# Patient Record
Sex: Female | Born: 1985 | Race: Black or African American | Hispanic: No | Marital: Single | Smoking: Never smoker
Health system: Southern US, Community
[De-identification: ages and names within clinical notes are randomized; demographics above are authoritative.]

## PROBLEM LIST (undated history)

## (undated) ENCOUNTER — Emergency Department (HOSPITAL_COMMUNITY): Admission: EM | Payer: Self-pay | Source: Home / Self Care

## (undated) ENCOUNTER — Inpatient Hospital Stay (HOSPITAL_COMMUNITY): Payer: Self-pay

## (undated) DIAGNOSIS — N730 Acute parametritis and pelvic cellulitis: Secondary | ICD-10-CM

## (undated) DIAGNOSIS — D573 Sickle-cell trait: Secondary | ICD-10-CM

## (undated) HISTORY — PX: NO PAST SURGERIES: SHX2092

---
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
## 2008-07-31 ENCOUNTER — Emergency Department (HOSPITAL_COMMUNITY): Admission: EM | Admit: 2008-07-31 | Discharge: 2008-07-31 | Payer: Self-pay | Admitting: Family Medicine

## 2010-01-10 ENCOUNTER — Emergency Department (HOSPITAL_COMMUNITY): Admission: EM | Admit: 2010-01-10 | Payer: Self-pay | Source: Home / Self Care | Admitting: Emergency Medicine

## 2010-04-07 ENCOUNTER — Other Ambulatory Visit: Payer: Self-pay | Admitting: Physician Assistant

## 2010-04-07 ENCOUNTER — Encounter: Payer: Self-pay | Admitting: Physician Assistant

## 2010-04-07 ENCOUNTER — Encounter: Payer: PRIVATE HEALTH INSURANCE | Admitting: Family

## 2010-04-07 DIAGNOSIS — N949 Unspecified condition associated with female genital organs and menstrual cycle: Secondary | ICD-10-CM

## 2010-04-07 DIAGNOSIS — R102 Pelvic and perineal pain: Secondary | ICD-10-CM

## 2010-04-07 DIAGNOSIS — Z01419 Encounter for gynecological examination (general) (routine) without abnormal findings: Secondary | ICD-10-CM

## 2010-04-07 LAB — CONVERTED CEMR LAB
FSH: 6.2 milliintl units/mL
HCV Ab: NEGATIVE
HIV: NONREACTIVE
Hepatitis B Surface Ag: NEGATIVE
LH: 3.6 milliintl units/mL
TSH: 1.972 microintl units/mL (ref 0.350–4.500)

## 2010-04-08 NOTE — Progress Notes (Signed)
Jennifer Burnett, [name] NO.:  [id number]  MEDICAL RECORD NO.:  [id number]           PATIENT TYPE:  A  LOCATION:  [facility]                   FACILITY:  [facility]  PHYSICIAN:  Maylon Cos, CNM    DATE OF BIRTH:  1985-02-21  DATE OF SERVICE:  04/07/2010                                 CLINIC NOTE  The patient is being seen at [facility]  REASON FOR TODAY'S VISIT:  Annual exam and Pap smear.  The patient also presents with complaints of abnormal bleeding.  She states that she has always had regular periods until this past December when she started having heavy periods that were lasting anywhere from 7 to 12 days and bleeding in between her periods.  Bleeding has been heavy enough for her to have to change a super tampon every 2-3 hours and she started passing small clots.  She denies symptoms of abnormal hair growth, pelvic pain outside of her menstrual cycles, or abnormal discharge.  PAST MEDICAL HISTORY:  The patient has no known drug allergies.  She is not currently taking any medication.  HEALTH CARE MAINTENANCE:  Her last dental exam was 6-8 months ago.  MENSTRUAL HISTORY:  She had menarche at age 25.  The first day of her last menstrual period was 03/30/2010.  CONTRACEPTIVE HISTORY:  She is currently trying to get pregnant, not on any birth control.  OBSTETRICAL HISTORY:  She is a gravida 1, para 0-0-1-0.  She had a termination at the age of 25.  GYNECOLOGICAL HISTORY:  Negative.  Last Pap smear is unknown, believed to be at age 25.  STD HISTORY:  The patient was treated at age 25 for PID.  She has no known history of endometriosis, fibroids, ovarian cyst.  PERSONAL MEDICAL HISTORY:  Negative.  SURGICAL HISTORY:  Negative.  SOCIAL HISTORY:  She is unemployed.  She does not smoke.  She occasionally drinks alcohol approximately 2-3 per week.  She had 1 sexual partner in the last year.  FAMILY HISTORY:  Positive for high blood  pressure in her grandmother.  REVIEW OF SYSTEMS:  Systemic review is negative other than what has already been reviewed in HPI.  PHYSICAL EXAMINATION:  GENERAL:  On examination, Jennifer Burnett is a pleasant 25- year-old [place] American female, appears to be her stated age. HEENT:  Grossly normal, in no apparent distress. VITAL SIGNS:  Blood pressure today is 132/92 and her weight is 173.9. NECK:  She has no thyromegaly. HEART:  Regular rate and rhythm without murmurs or bruits. LUNGS:  Clear to auscultation bilaterally A and P. BREASTS:  Symmetrical, soft without retraction or dimpling of the skin. Nipples are erect without discharge.  No masses, lesions, or tenderness to palpation. ABDOMEN:  Soft and nontender.  No hepatosplenomegaly. GENITALIA:  She has a shaved perineum without external lesions. Internal mucous membranes are pink without lesion.  She has a regular rugae and good tone.  Cervix is smooth and pink without lesions, and friable to exam.  Bimanual exam reveals no cervical motion tenderness. There is slight increase in size to approximately 9 weeks, however, there is no lobulation  or deformity in this contour.  Adnexa is nonenlarged and nontender. EXTREMITIES:  Lower extremities are without evidence of edema and equal pedal pulses.  ASSESSMENT: 1. Well-woman exam. 2. Dysfunctional uterine bleeding.  PLAN: 1. The patient should follow up for pelvic ultrasound to assess for     uterine fibroids or other causes of her bleeding. 2. Infection screenings are being sent today to assess for infection     causes for bleeding. 3. Pap sent per routine to Pathology. 4. The patient is encouraged to continue daily multivitamin with folic     acid as she is desiring pregnancy.  FOLLOWUP:  The patient should follow up in 2 weeks after her pelvic ultrasound to discuss the results and treatment options.          ______________________________ Maylon Cos, CNM    SS/MEDQ  D:   04/07/2010  T:  04/08/2010  Job:  [id number]

## 2010-04-19 ENCOUNTER — Ambulatory Visit (HOSPITAL_COMMUNITY)
Admission: RE | Admit: 2010-04-19 | Discharge: 2010-04-19 | Disposition: A | Discharge status: Home / Self Care | Payer: PRIVATE HEALTH INSURANCE | Source: Ambulatory Visit | Attending: Physician Assistant | Admitting: Physician Assistant

## 2010-04-19 DIAGNOSIS — R102 Pelvic and perineal pain: Secondary | ICD-10-CM

## 2010-04-19 DIAGNOSIS — N938 Other specified abnormal uterine and vaginal bleeding: Secondary | ICD-10-CM | POA: Insufficient documentation

## 2010-04-19 DIAGNOSIS — N949 Unspecified condition associated with female genital organs and menstrual cycle: Secondary | ICD-10-CM | POA: Insufficient documentation

## 2010-04-27 ENCOUNTER — Ambulatory Visit: Payer: PRIVATE HEALTH INSURANCE | Admitting: Obstetrics and Gynecology

## 2010-04-27 DIAGNOSIS — N949 Unspecified condition associated with female genital organs and menstrual cycle: Secondary | ICD-10-CM

## 2010-04-28 ENCOUNTER — Encounter: Payer: Self-pay | Admitting: Obstetrics & Gynecology

## 2010-04-28 NOTE — Progress Notes (Signed)
Jennifer Burnett, MACQUARRIE NO.:  [id number]  MEDICAL RECORD NO.:  [id number]           PATIENT TYPE:  A  [facility]ATION[facility]  [facility]                   FACILITY:  [facility]  PHYSICIAN:  Argentina Donovan, MD        DATE OF BIRTH:  1985/08/21  DATE OF SERVICE:  04/27/2010                                 CLINIC NOTE  The patient is a 25 year old African American female who was seen a short-time ago because of dysfunctional uterine bleeding.  Periods were normal until January and then she started having 2 periods that were lasting for 2 weeks.  She had an ultrasound done which was completely normal.  She had a Pap smear which was normal and she had a TSH workup as well as an FSH and LH which were all normal.  Also, she was checked for STDs and that were negative.  She said her last period did last 2 weeks.  I told her to observe them.  If they continue, to call in with the pharmacy number and will call in some birth control pills for her to take for few months.  Hopefully that will settle things down and when she stops, often times she will go back to normal cycle.  IMPRESSION:  Dysfunctional uterine bleeding, etiology unknown.          ______________________________ Argentina Donovan, MD    PR/MEDQ  D:  04/27/2010  T:  04/28/2010  Job:  [id number]

## 2011-04-07 LAB — OB RESULTS CONSOLE RPR: RPR: NONREACTIVE

## 2011-05-16 ENCOUNTER — Encounter (HOSPITAL_COMMUNITY): Payer: Self-pay | Admitting: Emergency Medicine

## 2011-05-16 ENCOUNTER — Emergency Department (INDEPENDENT_AMBULATORY_CARE_PROVIDER_SITE_OTHER)
Admission: EM | Admit: 2011-05-16 | Discharge: 2011-05-16 | Disposition: A | Discharge status: Home / Self Care | Payer: Self-pay | Source: Home / Self Care | Attending: Family Medicine | Admitting: Family Medicine

## 2011-05-16 DIAGNOSIS — H6091 Unspecified otitis externa, right ear: Secondary | ICD-10-CM

## 2011-05-16 DIAGNOSIS — J302 Other seasonal allergic rhinitis: Secondary | ICD-10-CM

## 2011-05-16 DIAGNOSIS — J309 Allergic rhinitis, unspecified: Secondary | ICD-10-CM

## 2011-05-16 MED ORDER — NEOMYCIN-POLYMYXIN-HC 3.5-10000-1 OT SUSP: 4.0000 [drp] | Freq: Three times a day (TID) | OTIC | Status: AC

## 2011-05-16 NOTE — ED Provider Notes (Signed)
History     CSN: [id number]  Arrival date & time 05/16/11  1219   First MD Initiated Contact with Patient 05/16/11 1250      Chief Complaint  Patient presents with  . Otalgia  . Allergies    (Consider location/radiation/quality/duration/timing/severity/associated sxs/prior treatment) Patient is a 26 y.o. female presenting with ear pain. The history is provided by the patient and a parent.  Otalgia This is a new problem. The current episode started more than 1 week ago. There is pain in both ears. The problem has been gradually worsening. There has been no fever. The pain is mild. Associated symptoms include ear discharge and rhinorrhea. Pertinent negatives include no diarrhea, no vomiting, no neck pain, no cough and no rash.    History reviewed. No pertinent past medical history.  History reviewed. No pertinent past surgical history.  No family history on file.  History  Substance Use Topics  . Smoking status: Never Smoker   . Smokeless tobacco: Not on file  . Alcohol Use: No    OB History    Grav Para Term Preterm Abortions TAB SAB Ect Mult Living                  Review of Systems  Constitutional: Negative.   HENT: Positive for ear pain, congestion, rhinorrhea and ear discharge. Negative for neck pain.   Respiratory: Negative for cough.   Gastrointestinal: Negative for vomiting and diarrhea.  Skin: Negative for rash.    Allergies  Review of patient's allergies indicates no known allergies.  Home Medications   Current Outpatient Rx  Name Route Sig Dispense Refill  . CETIRIZINE HCL 10 MG PO TABS Oral Take 10 mg by mouth daily.    . NEOMYCIN-POLYMYXIN-HC 3.5-10000-1 OT SUSP Both Ears Place 4 drops into both ears 3 (three) times daily. 10 mL 1    LMP 04/18/2011  Physical Exam  Nursing note and vitals reviewed. Constitutional: She appears well-developed and well-nourished.  HENT:  Head: Normocephalic.  Right Ear: Hearing and tympanic membrane normal.  There is drainage and tenderness. Tympanic membrane is not injected and not scarred.  Left Ear: Hearing and tympanic membrane normal. There is drainage and tenderness. Tympanic membrane is not injected and not scarred.  Nose: Mucosal edema and rhinorrhea present.  Eyes: Pupils are equal, round, and reactive to light.  Neck: Normal range of motion. Neck supple.  Cardiovascular: Normal rate, normal heart sounds and intact distal pulses.   Pulmonary/Chest: Breath sounds normal.  Lymphadenopathy:    She has cervical adenopathy.    ED Course  Procedures (including critical care time)  Labs Reviewed - No data to display No results found.   1. External otitis, right   2. Seasonal allergies       MDM          Linna Hoff, MD 05/16/11 1350

## 2011-05-16 NOTE — Discharge Instructions (Signed)
Continue your zyrtec and flonase, use ear drops as prescribed for 5-7 days, return if needed.

## 2011-05-16 NOTE — ED Notes (Signed)
PT HERE WITH BILAT EAR PAIN THAT STARTED OFF IN TH R THEN RADIATED TO L WITH CONSTANT,ACHY,THROB PAIN AND H/A.SX STARTED X 1 WEEK AGO WITH ALLERGY SX.DENIES BLURRY VISION OR DIZZINESS

## 2011-06-20 ENCOUNTER — Encounter (HOSPITAL_COMMUNITY): Payer: Self-pay | Admitting: Emergency Medicine

## 2011-06-20 ENCOUNTER — Emergency Department (HOSPITAL_COMMUNITY)
Admission: EM | Admit: 2011-06-20 | Discharge: 2011-06-20 | Disposition: A | Discharge status: Home / Self Care | Payer: Medicaid Other | Attending: Emergency Medicine | Admitting: Emergency Medicine

## 2011-06-20 DIAGNOSIS — O200 Threatened abortion: Secondary | ICD-10-CM

## 2011-06-20 LAB — CBC
HCT: 40.1 % (ref 36.0–46.0)
Hemoglobin: 14.5 g/dL (ref 12.0–15.0)
MCHC: 36.2 g/dL — ABNORMAL HIGH (ref 30.0–36.0)
MCV: 80.5 fL (ref 78.0–100.0)

## 2011-06-20 LAB — DIFFERENTIAL
Basophils Relative: 0 % (ref 0–1)
Eosinophils Relative: 1 % (ref 0–5)
Lymphocytes Relative: 14 % (ref 12–46)
Monocytes Absolute: 1.2 10*3/uL — ABNORMAL HIGH (ref 0.1–1.0)
Monocytes Relative: 10 % (ref 3–12)
Neutro Abs: 8.8 10*3/uL — ABNORMAL HIGH (ref 1.7–7.7)

## 2011-06-20 LAB — HCG, QUANTITATIVE, PREGNANCY: hCG, Beta Chain, Quant, S: 120595 m[IU]/mL — ABNORMAL HIGH (ref <5)

## 2011-06-20 LAB — POCT I-STAT, CHEM 8
BUN: 5 mg/dL — ABNORMAL LOW (ref 6–23)
Creatinine, Ser: 0.6 mg/dL (ref 0.50–1.10)
Potassium: 3.5 mEq/L (ref 3.5–5.1)
Sodium: 140 mEq/L (ref 135–145)

## 2011-06-20 MED ORDER — SODIUM CHLORIDE 0.9 % IV BOLUS (SEPSIS)
1000.0000 mL | Freq: Once | INTRAVENOUS | Status: AC
  Administered 2011-06-20: 1000 mL via INTRAVENOUS

## 2011-06-20 NOTE — ED Notes (Signed)
Onset one week ago scant vaginal bleeding and today large amount of vaginal bleeding dark red and is [redacted] weeks pregnant.  LMP April 2.

## 2011-06-20 NOTE — ED Notes (Signed)
Pt. D/C home. Instructed to follow up with [facility] in  48hrs.

## 2011-06-20 NOTE — ED Provider Notes (Signed)
History     CSN: [id number]  Arrival date & time 06/20/11  1207   First MD Initiated Contact with Patient 06/20/11 1429      Chief Complaint  Patient presents with  . Vaginal Bleeding    (Consider location/radiation/quality/duration/timing/severity/associated sxs/prior treatment) Patient is a 26 y.o. female presenting with vaginal bleeding. The history is provided by the patient.  Vaginal Bleeding Pertinent negatives include no abdominal pain and no shortness of breath.   the patient is a 26 year old, female, G1, P0 at 60 weeks estimated gestational age.  She complains of heavy vaginal bleeding.  Today.  She denies lightheadedness, shortness of breath.  Pain.  History reviewed. No pertinent past medical history.  History reviewed. No pertinent past surgical history.  No family history on file.  History  Substance Use Topics  . Smoking status: Never Smoker   . Smokeless tobacco: Not on file  . Alcohol Use: No    OB History    Grav Para Term Preterm Abortions TAB SAB Ect Mult Living   1               Review of Systems  Constitutional: Negative for fever and chills.  Respiratory: Negative for shortness of breath.   Cardiovascular: Negative for palpitations.  Gastrointestinal: Negative for nausea, vomiting and abdominal pain.  Genitourinary: Positive for vaginal bleeding.  Neurological: Negative for dizziness and light-headedness.  Psychiatric/Behavioral: Negative for confusion.  All other systems reviewed and are negative.    Allergies  Review of patient's allergies indicates no known allergies.  Home Medications   Current Outpatient Rx  Name Route Sig Dispense Refill  . PRENATAL 27-0.8 MG PO TABS Oral Take 1 tablet by mouth daily with breakfast.      BP 144/99  Pulse 107  Temp(Src) 98.6 F (37 C) (Oral)  Resp 19  Ht 5\' 4"  (1.626 m)  Wt 133 lb (60.328 kg)  BMI 22.83 kg/m2  SpO2 100%  LMP 04/18/2011  Physical Exam  Nursing note and vitals  reviewed. Constitutional: She is oriented to person, place, and time. She appears well-developed and well-nourished. No distress.  HENT:  Head: Normocephalic and atraumatic.  Eyes: Conjunctivae and EOM are normal.  Neck: Normal range of motion.  Cardiovascular:  No murmur heard.      Tachycardia  Pulmonary/Chest: Effort normal and breath sounds normal. No respiratory distress.  Abdominal: Soft. She exhibits no distension. There is no tenderness.  Genitourinary: Vagina normal. No vaginal discharge found.       Very small amt of blood in vault.  Removed with single fox swab.  Os closed to visual inspection and digital exam. No adnexal masses  Musculoskeletal: Normal range of motion. She exhibits no edema.  Neurological: She is alert and oriented to person, place, and time.  Skin: Skin is warm and dry.  Psychiatric: She has a normal mood and affect. Thought content normal.    ED Course  Procedures (including critical care time) 26 year old pregnant female, at approximately 9 weeks estimated gestational age, presents with painless vaginal bleeding.  She is mildly tachycardic.  He wait for ectopic versus miscarriage.  Labs Reviewed  HCG, QUANTITATIVE, PREGNANCY - Abnormal; Notable for the following:    hCG, Beta Chain, Mahalia Longest [id number] (*)    All other components within normal limits  CBC - Abnormal; Notable for the following:    WBC 11.7 (*)    MCHC 36.2 (*)    All other components within normal limits  DIFFERENTIAL - Abnormal;  Notable for the following:    Neutro Abs 8.8 (*)    Monocytes Absolute 1.2 (*)    All other components within normal limits  ABO/RH  CBC   No results found.   No diagnosis found.  4:08 PM Spoke with dr. Vincente Poli. She agrees with plan to f/u in 48 hrs  MDM  Genital bleeding, and gravid female Threatened miscarriage        Cheri Guppy, MD 06/20/11 [phone]

## 2011-06-20 NOTE — Discharge Instructions (Signed)
Followup with the obstetrician at [facility] hospital in 48 hours for repeat blood tests, and evaluation.  Return for pain, lightheadedness, difficulty breathing, or uncontrolled bleeding

## 2011-06-20 NOTE — ED Notes (Signed)
Pt. Reports vaginal bleed at approx 1030 this morning. States "my underwear was soaked with blood". "When I wiped it was bright red.  Pt. Reports falling down stairs on 06/14/11 "but not hitting her stomach". Abdomen is non tender on palpation. Pt. Reports Nausea last night. Denies V/D. Pt. Denies headache, weakness, SOB. A.O. X 4

## 2011-06-22 ENCOUNTER — Encounter (HOSPITAL_COMMUNITY): Payer: Self-pay

## 2011-06-22 ENCOUNTER — Inpatient Hospital Stay (HOSPITAL_COMMUNITY): Payer: Medicaid Other

## 2011-06-22 ENCOUNTER — Inpatient Hospital Stay (HOSPITAL_COMMUNITY)
Admission: AD | Admit: 2011-06-22 | Discharge: 2011-06-22 | Disposition: A | Discharge status: Home / Self Care | Payer: Medicaid Other | Source: Ambulatory Visit | Attending: Obstetrics & Gynecology | Admitting: Obstetrics & Gynecology

## 2011-06-22 DIAGNOSIS — O209 Hemorrhage in early pregnancy, unspecified: Secondary | ICD-10-CM | POA: Insufficient documentation

## 2011-06-22 DIAGNOSIS — B9689 Other specified bacterial agents as the cause of diseases classified elsewhere: Secondary | ICD-10-CM | POA: Insufficient documentation

## 2011-06-22 DIAGNOSIS — N760 Acute vaginitis: Secondary | ICD-10-CM | POA: Insufficient documentation

## 2011-06-22 DIAGNOSIS — A499 Bacterial infection, unspecified: Secondary | ICD-10-CM | POA: Insufficient documentation

## 2011-06-22 LAB — WET PREP, GENITAL: Trich, Wet Prep: NONE SEEN

## 2011-06-22 MED ORDER — METRONIDAZOLE 500 MG PO TABS: 500.0000 mg | ORAL_TABLET | Freq: Two times a day (BID) | ORAL | Status: AC

## 2011-06-22 NOTE — MAU Note (Signed)
Pt states went to [facility] for bleeding, knew she was pregnant, however had underwear that was filled in minutes with bright red blood, no clots noted. Denies pain or bleeding at present.

## 2011-06-22 NOTE — Discharge Instructions (Signed)
Prenatal Care Providers °[facility]    [facility] ° Phone- [phone]     Phone: [phone] °         °[facility]                      [facility] ° @[place]     Phone: [phone] ° Phone: [phone] °        [facility] °[facility]     Phone: [phone] ° Phone: [phone]   °        [facility] °[facility] [place]                hone: [phone] ° Phone: [phone] °        [facility] °Dr. Bernard Marshall      Phone: [phone] ° Phone: [phone] °        [facility] °[facility]                Phone: [phone] ° Women’s [facility]  ° Phone:[phone]    [facility] °         Phone: [phone] °[facility] °  Phone: [phone] ° ° °Bacterial Vaginosis °Bacterial vaginosis (BV) is a vaginal infection where the normal balance of bacteria in the vagina is disrupted. The normal balance is then replaced by an overgrowth of certain bacteria. There are several different kinds of bacteria that can cause BV. BV is the most common vaginal infection in women of childbearing age. °CAUSES  °· The cause of BV is not fully understood. BV develops when there is an increase or imbalance of harmful bacteria.  °· Some activities or behaviors can upset the normal balance of bacteria in the vagina and put women at increased risk including:  °· Having a new sex partner or multiple sex partners.  °· Douching.  °· Using an intrauterine device (IUD) for contraception.  °· It is not clear what role sexual activity plays in the development of BV. However, women that have never had sexual intercourse are rarely infected with BV.  °Women do not get BV from toilet seats, bedding, swimming pools or from touching objects around them.  °SYMPTOMS  °· Grey vaginal discharge.  °· A fish-like odor with discharge, especially after sexual intercourse.   °· Itching or burning of the vagina and vulva.  °· Burning or pain with urination.  °· Some women have no signs or symptoms at all.  °DIAGNOSIS  °Your caregiver must examine the vagina for signs of BV. Your caregiver will perform lab tests and look at the sample of vaginal fluid through a microscope. They will look for bacteria and abnormal cells (clue cells), a pH test higher than 4.5, and a positive amine test all associated with BV.  °RISKS AND COMPLICATIONS  °· Pelvic inflammatory disease (PID).  °· Infections following gynecology surgery.  °· Developing HIV.  °· Developing herpes virus.  °TREATMENT  °Sometimes BV will clear up without treatment. However, all women with symptoms of BV should be treated to avoid complications, especially if gynecology surgery is planned. Female partners generally do not need to be treated. However, BV may spread between female sex partners so treatment is helpful in preventing a recurrence of BV.  °· BV may be treated with antibiotics. The antibiotics come in either pill or vaginal cream forms.   Either can be used with nonpregnant or pregnant women, but the recommended dosages differ. These antibiotics are not harmful to the baby.  °· BV can recur after treatment. If this happens, a second round of antibiotics will often be prescribed.  °· Treatment is important for pregnant women. If not treated, BV can cause a premature delivery, especially for a pregnant woman who had a premature birth in the past. All pregnant women who have symptoms of BV should be checked and treated.  °· For chronic reoccurrence of BV, treatment with a type of prescribed gel vaginally twice a week is helpful.  °HOME CARE INSTRUCTIONS  °· Finish all medication as directed by your caregiver.  °· Do not have sex until treatment is completed.  °· Tell your sexual partner that you have a vaginal infection. They should see their caregiver and be treated if they have problems, such as a mild rash or itching.   °· Practice safe sex. Use condoms. Only have 1 sex partner.  °PREVENTION  °Basic prevention steps can help reduce the risk of upsetting the natural balance of bacteria in the vagina and developing BV: °· Do not have sexual intercourse (be abstinent).  °· Do not douche.  °· Use all of the medicine prescribed for treatment of BV, even if the signs and symptoms go away.  °· Tell your sex partner if you have BV. That way, they can be treated, if needed, to prevent reoccurrence.  °SEEK MEDICAL CARE IF:  °· Your symptoms are not improving after 3 days of treatment.  °· You have increased discharge, pain, or fever.  °MAKE SURE YOU:  °· Understand these instructions.  °· Will watch your condition.  °· Will get help right away if you are not doing well or get worse.  °FOR MORE INFORMATION  °Division of STD Prevention (DSTDP), Centers for Disease Control and Prevention: www.cdc.gov/std °American Social Health Association (ASHA): www.ashastd.org  °Document Released: 01/02/2005 Document Revised: 12/22/2010 Document Reviewed: 06/25/2008 °ExitCare® Patient Information ©2012 [employer].Bacterial Vaginosis °Bacterial vaginosis (BV) is a vaginal infection where the normal balance of bacteria in the vagina is disrupted. The normal balance is then replaced by an overgrowth of certain bacteria. There are several different kinds of bacteria that can cause BV. BV is the most common vaginal infection in women of childbearing age. °CAUSES  °· The cause of BV is not fully understood. BV develops when there is an increase or imbalance of harmful bacteria.  °· Some activities or behaviors can upset the normal balance of bacteria in the vagina and put women at increased risk including:  °· Having a new sex partner or multiple sex partners.  °· Douching.  °· Using an intrauterine device (IUD) for contraception.  °· It is not clear what role sexual activity plays in the development of BV. However, women that have never had sexual intercourse  are rarely infected with BV.  °Women do not get BV from toilet seats, bedding, swimming pools or from touching objects around them.  °SYMPTOMS  °· Grey vaginal discharge.  °· A fish-like odor with discharge, especially after sexual intercourse.  °· Itching or burning of the vagina and vulva.  °· Burning or pain with urination.  °· Some women have no signs or symptoms at all.  °DIAGNOSIS  °Your caregiver must examine the vagina for signs of BV. Your caregiver will perform lab tests and look at the sample of vaginal fluid through a microscope. They will look for bacteria and abnormal   cells (clue cells), a pH test higher than 4.5, and a positive amine test all associated with BV.  °RISKS AND COMPLICATIONS  °· Pelvic inflammatory disease (PID).  °· Infections following gynecology surgery.  °· Developing HIV.  °· Developing herpes virus.  °TREATMENT  °Sometimes BV will clear up without treatment. However, all women with symptoms of BV should be treated to avoid complications, especially if gynecology surgery is planned. Female partners generally do not need to be treated. However, BV may spread between female sex partners so treatment is helpful in preventing a recurrence of BV.  °· BV may be treated with antibiotics. The antibiotics come in either pill or vaginal cream forms. Either can be used with nonpregnant or pregnant women, but the recommended dosages differ. These antibiotics are not harmful to the baby.  °· BV can recur after treatment. If this happens, a second round of antibiotics will often be prescribed.  °· Treatment is important for pregnant women. If not treated, BV can cause a premature delivery, especially for a pregnant woman who had a premature birth in the past. All pregnant women who have symptoms of BV should be checked and treated.  °· For chronic reoccurrence of BV, treatment with a type of prescribed gel vaginally twice a week is helpful.  °HOME CARE INSTRUCTIONS  °· Finish all medication as  directed by your caregiver.  °· Do not have sex until treatment is completed.  °· Tell your sexual partner that you have a vaginal infection. They should see their caregiver and be treated if they have problems, such as a mild rash or itching.  °· Practice safe sex. Use condoms. Only have 1 sex partner.  °PREVENTION  °Basic prevention steps can help reduce the risk of upsetting the natural balance of bacteria in the vagina and developing BV: °· Do not have sexual intercourse (be abstinent).  °· Do not douche.  °· Use all of the medicine prescribed for treatment of BV, even if the signs and symptoms go away.  °· Tell your sex partner if you have BV. That way, they can be treated, if needed, to prevent reoccurrence.  °SEEK MEDICAL CARE IF:  °· Your symptoms are not improving after 3 days of treatment.  °· You have increased discharge, pain, or fever.  °MAKE SURE YOU:  °· Understand these instructions.  °· Will watch your condition.  °· Will get help right away if you are not doing well or get worse.  °FOR MORE INFORMATION  °Division of STD Prevention (DSTDP), Centers for Disease Control and Prevention: www.cdc.gov/std °American Social Health Association (ASHA): www.ashastd.org  °Document Released: 01/02/2005 Document Revised: 12/22/2010 Document Reviewed: 06/25/2008 °ExitCare® Patient Information ©2012 [employer]. °

## 2011-06-22 NOTE — MAU Provider Note (Signed)
Medical Screening exam and patient care preformed by advanced practice provider.  Agree with the above management.  

## 2011-06-22 NOTE — MAU Provider Note (Signed)
History     CSN: [id number]  Arrival date and time: 06/22/11 1034   First Provider Initiated Contact with Patient 06/22/11 1123      Chief Complaint  Patient presents with  . Follow-up   HPI  Pt is here for follow-up;  Seen in [place] on 06/20/11, pt was experiencing bleeding, but no pain at that time.  Bloodwork was completed, but ultrasound was deferred and pt told to follow-up in 48 hours.  Pt has a blood type of O+ and had a BHCG of Results for FLORENA, KOZMA (MRN [id number]) as of 06/22/2011 13:09  Ref. Range 04/19/2010 08:41 06/20/2011 12:46 06/20/2011 12:50 06/20/2011 12:54 06/20/2011 15:38 06/21/2011 06:40 06/22/2011 11:34 06/22/2011 12:40  hCG, Beta Chain, Quant, S Latest Range: <5 mIU/mL  120595 (H)         Pt here today with no report of abdominal pain or bleeding.  No other problems or concerns.  Past Medical History  Diagnosis Date  . No pertinent past medical history     Past Surgical History  Procedure Date  . No past surgeries     Family History  Problem Relation Age of Onset  . Anesthesia problems Neg Hx   . Hypotension Neg Hx   . Malignant hyperthermia Neg Hx   . Pseudochol deficiency Neg Hx     History  Substance Use Topics  . Smoking status: Never Smoker   . Smokeless tobacco: Never Used  . Alcohol Use: No    Allergies: No Known Allergies  Prescriptions prior to admission  Medication Sig Dispense Refill  . Prenatal Vit-Fe Fumarate-FA (MULTIVITAMIN-PRENATAL) 27-0.8 MG TABS Take 1 tablet by mouth daily with breakfast.        ROS Physical Exam   Blood pressure 129/84, pulse 90, temperature 98.2 F (36.8 C), temperature source Oral, resp. rate 16, height 5\' 4"  (1.626 m), weight 60.963 kg (134 lb 6.4 oz), last menstrual period 04/18/2011, SpO2 100.00%.  Physical Exam  Constitutional: She is oriented to person, place, and time. She appears well-developed and well-nourished. No distress.  HENT:  Head: Normocephalic.  Neck: Normal range of motion. Neck supple.    Cardiovascular: Normal rate, regular rhythm and normal heart sounds.  Exam reveals no gallop and no friction rub.   No murmur heard. Respiratory: Effort normal and breath sounds normal. No respiratory distress.  GI: She exhibits no mass. There is no tenderness. There is no rebound, no guarding and no CVA tenderness.  Genitourinary: Uterus is enlarged. Cervix exhibits no motion tenderness and no discharge. Vaginal discharge (white, creamy) found.  Musculoskeletal: Normal range of motion.  Neurological: She is alert and oriented to person, place, and time.  Skin: Skin is warm and dry.  Psychiatric: She has a normal mood and affect.    MAU Course  Procedures  Results for orders placed during the hospital encounter of 06/22/11 (from the past 24 hour(s))  WET PREP, GENITAL     Status: Abnormal   Collection Time   06/22/11 11:34 AM      Component Value Range   Yeast Wet Prep HPF POC NONE SEEN  NONE SEEN    Trich, Wet Prep NONE SEEN  NONE SEEN    Clue Cells Wet Prep HPF POC MODERATE (*) NONE SEEN    WBC, Wet Prep HPF POC FEW (*) NONE SEEN      Assessment and Plan  Bleeding in Early Pregnancy Bacterial Vaginosis  Plan: DC to home RX Flagyl Bleeding precautions  [facility]  06/22/2011, 1:20 PM

## 2011-06-22 NOTE — MAU Note (Signed)
Patient was seen at [facility] ED on 6-4 and instructed to come to MAU today for evaluation. Patient denies any pain or bleeding.

## 2011-06-23 LAB — GC/CHLAMYDIA PROBE AMP, GENITAL
Chlamydia, DNA Probe: NEGATIVE
GC Probe Amp, Genital: NEGATIVE

## 2011-07-11 LAB — OB RESULTS CONSOLE HIV ANTIBODY (ROUTINE TESTING): HIV: NONREACTIVE

## 2011-07-14 LAB — OB RESULTS CONSOLE RUBELLA ANTIBODY, IGM: Rubella: IMMUNE

## 2011-07-14 LAB — OB RESULTS CONSOLE ANTIBODY SCREEN: Antibody Screen: NEGATIVE

## 2011-07-31 ENCOUNTER — Other Ambulatory Visit: Payer: Self-pay | Admitting: Obstetrics & Gynecology

## 2011-09-30 ENCOUNTER — Encounter (HOSPITAL_COMMUNITY): Payer: Self-pay | Admitting: *Deleted

## 2011-09-30 ENCOUNTER — Inpatient Hospital Stay (HOSPITAL_COMMUNITY)
Admission: AD | Admit: 2011-09-30 | Discharge: 2011-11-03 | DRG: 765 | Disposition: A | Discharge status: Home / Self Care | Payer: Medicaid Other | Source: Ambulatory Visit | Attending: Obstetrics | Admitting: Obstetrics

## 2011-09-30 DIAGNOSIS — O2690 Pregnancy related conditions, unspecified, unspecified trimester: Secondary | ICD-10-CM

## 2011-09-30 DIAGNOSIS — O4100X0 Oligohydramnios, unspecified trimester, not applicable or unspecified: Secondary | ICD-10-CM | POA: Diagnosis present

## 2011-09-30 DIAGNOSIS — O24419 Gestational diabetes mellitus in pregnancy, unspecified control: Secondary | ICD-10-CM

## 2011-09-30 DIAGNOSIS — O321XX0 Maternal care for breech presentation, not applicable or unspecified: Secondary | ICD-10-CM | POA: Diagnosis present

## 2011-09-30 DIAGNOSIS — Z98891 History of uterine scar from previous surgery: Secondary | ICD-10-CM

## 2011-09-30 DIAGNOSIS — O3430 Maternal care for cervical incompetence, unspecified trimester: Secondary | ICD-10-CM | POA: Diagnosis present

## 2011-09-30 DIAGNOSIS — O4290 Premature rupture of membranes, unspecified as to length of time between rupture and onset of labor, unspecified weeks of gestation: Principal | ICD-10-CM | POA: Diagnosis present

## 2011-09-30 DIAGNOSIS — O99814 Abnormal glucose complicating childbirth: Secondary | ICD-10-CM | POA: Diagnosis present

## 2011-09-30 DIAGNOSIS — O4700 False labor before 37 completed weeks of gestation, unspecified trimester: Secondary | ICD-10-CM | POA: Diagnosis present

## 2011-09-30 HISTORY — DX: Sickle-cell trait: D57.3

## 2011-09-30 HISTORY — DX: Acute parametritis and pelvic cellulitis: N73.0

## 2011-09-30 LAB — CBC
MCH: 29.2 pg (ref 26.0–34.0)
MCHC: 34.3 g/dL (ref 30.0–36.0)
MCV: 85.3 fL (ref 78.0–100.0)
Platelets: 241 10*3/uL (ref 150–400)
RDW: 14.2 % (ref 11.5–15.5)

## 2011-09-30 MED ORDER — DEXTROSE 5 % IV SOLN
500.0000 mg | INTRAVENOUS | Status: DC
  Administered 2011-09-30 – 2011-10-01 (×2): 500 mg via INTRAVENOUS
  Filled 2011-09-30 (×3): qty 500

## 2011-09-30 MED ORDER — DOCUSATE SODIUM 100 MG PO CAPS
100.0000 mg | ORAL_CAPSULE | Freq: Every day | ORAL | Status: DC
  Administered 2011-09-30 – 2011-10-31 (×32): 100 mg via ORAL
  Filled 2011-09-30 (×33): qty 1

## 2011-09-30 MED ORDER — ZOLPIDEM TARTRATE 5 MG PO TABS: 5.0000 mg | ORAL_TABLET | Freq: Every evening | ORAL | Status: DC | PRN

## 2011-09-30 MED ORDER — MAGNESIUM SULFATE 40 G IN LACTATED RINGERS - SIMPLE
2.0000 g/h | INTRAVENOUS | Status: DC
  Administered 2011-10-01: 2 g/h via INTRAVENOUS
  Filled 2011-09-30 (×3): qty 500

## 2011-09-30 MED ORDER — BETAMETHASONE SOD PHOS & ACET 6 (3-3) MG/ML IJ SUSP
12.0000 mg | INTRAMUSCULAR | Status: AC
  Administered 2011-09-30 – 2011-10-01 (×2): 12 mg via INTRAMUSCULAR
  Filled 2011-09-30 (×2): qty 2

## 2011-09-30 MED ORDER — LACTATED RINGERS IV SOLN
INTRAVENOUS | Status: DC
  Administered 2011-09-30 – 2011-10-01 (×2): via INTRAVENOUS
  Administered 2011-10-01: 100 mL via INTRAVENOUS
  Administered 2011-10-02: 01:00:00 via INTRAVENOUS

## 2011-09-30 MED ORDER — PRENATAL MULTIVITAMIN CH
1.0000 | ORAL_TABLET | Freq: Every day | ORAL | Status: DC
  Administered 2011-09-30 – 2011-10-31 (×32): 1 via ORAL
  Filled 2011-09-30 (×35): qty 1

## 2011-09-30 MED ORDER — CALCIUM CARBONATE ANTACID 500 MG PO CHEW
2.0000 | CHEWABLE_TABLET | ORAL | Status: DC | PRN
  Administered 2011-09-30: 400 mg via ORAL
  Filled 2011-09-30 (×2): qty 1

## 2011-09-30 MED ORDER — INFLUENZA VIRUS VACC SPLIT PF IM SUSP
0.5000 mL | INTRAMUSCULAR | Status: AC
  Filled 2011-09-30: qty 0.5

## 2011-09-30 MED ORDER — MAGNESIUM SULFATE BOLUS VIA INFUSION
4.0000 g | Freq: Once | INTRAVENOUS | Status: AC
  Administered 2011-09-30: 4 g via INTRAVENOUS
  Filled 2011-09-30: qty 500

## 2011-09-30 MED ORDER — SODIUM CHLORIDE 0.9 % IV SOLN
2.0000 g | Freq: Four times a day (QID) | INTRAVENOUS | Status: DC
  Administered 2011-09-30 – 2011-10-02 (×7): 2 g via INTRAVENOUS
  Filled 2011-09-30 (×8): qty 2000

## 2011-09-30 MED ORDER — ACETAMINOPHEN 325 MG PO TABS: 650.0000 mg | ORAL_TABLET | ORAL | Status: DC | PRN

## 2011-09-30 NOTE — MAU Note (Signed)
Clear, thick mucous noted x2 yesterday after voiding.  Had some back pain and cramping yesterday.  Feels fine today, no further d/c.  Had called office, advice nurse instructed her to followup here.

## 2011-09-30 NOTE — MAU Note (Signed)
During pelvic- visual change noted if cervix, appears thinned out and approx 1 cm open.

## 2011-09-30 NOTE — H&P (Signed)
Jennifer Burnett is a 26 y.o. female presenting for passing mucous. Maternal Medical History:  Reason for admission: 26 yo G1.   EDC 01-23-12.  Presents to triage after passing mucous.  Denies intercourse x 1 month.  Denies cramping or spotting.  Fetal activity: Perceived fetal activity is normal.   Last perceived fetal movement was within the past hour.    Prenatal complications: no prenatal complications Prenatal Complications - Diabetes: none.    OB History    Grav Para Term Preterm Abortions TAB SAB Ect Mult Living   1              Past Medical History  Diagnosis Date  . Sickle cell trait   . PID (acute pelvic inflammatory disease)     as teenager   Past Surgical History  Procedure Date  . No past surgeries    Family History: family history is negative for Anesthesia problems, and Hypotension, and Malignant hyperthermia, and Pseudochol deficiency, and Other, . Social History:  reports that she has never smoked. She has never used smokeless tobacco. She reports that she does not drink alcohol or use illicit drugs.   Prenatal Transfer Tool  Maternal Diabetes: Not tested Genetic Screening: Normal Maternal Ultrasounds/Referrals: Normal Fetal Ultrasounds or other Referrals:  None Maternal Substance Abuse:  No Significant Maternal Medications:  Meds include: Other:  Significant Maternal Lab Results:  Lab values include: Other:  Other Comments:  None  Review of Systems  All other systems reviewed and are negative.    Dilation: 1 (visual) Exam by:: Poe Blood pressure 113/67, pulse 90, temperature 99.1 F (37.3 C), temperature source Oral, resp. rate 18, height 5\' 4"  (1.626 m), weight 73.211 kg (161 lb 6.4 oz), last menstrual period 04/18/2011, SpO2 100.00%. Maternal Exam:  Abdomen: Patient reports no abdominal tenderness. Cervix: Cervix evaluated by sterile speculum exam.     Physical Exam  Constitutional: She is oriented to person, place, and time. She appears  well-developed and well-nourished.  HENT:  Head: Normocephalic and atraumatic.  Eyes: Conjunctivae normal are normal. Pupils are equal, round, and reactive to light.  Neck: Normal range of motion. Neck supple.  Cardiovascular: Normal rate and regular rhythm.   Respiratory: Effort normal.  GI: Soft.  Musculoskeletal: Normal range of motion.  Neurological: She is alert and oriented to person, place, and time.  Skin: Skin is warm and dry.  Psychiatric: She has a normal mood and affect. Her behavior is normal. Judgment and thought content normal.    Prenatal labs: ABO, Rh: --/--/O POS (06/04 1250) Antibody:   Rubella:   RPR:    HBsAg:    HIV:    GBS:     Assessment/Plan: 23 weeks.  Preterm cervical changes.  Admit.  Bedrest.  Steroids and magnesium sulfate.  MFM consult on Monday.   Jennifer Burnett A 09/30/2011, 2:06 PM

## 2011-09-30 NOTE — MAU Provider Note (Signed)
Chief Complaint: Vaginal Discharge   First Provider Initiated Contact with Patient 09/30/11 1143     SUBJECTIVE HPI: Jennifer Burnett is a 26 y.o. G1P0 at [redacted]w[redacted]d by LMP who presents to MAU reporting passage of mucousy discharge from vagina on 2 instances last night. No associated abdominal pain. Still pain-free. No vaginal bleeding. No previous episodes. Last intercourse one month ago. No irritative vaginal discharge.  Past Medical History  Diagnosis Date  . Sickle cell trait   . PID (acute pelvic inflammatory disease)     as teenager   OB History    Grav Para Term Preterm Abortions TAB SAB Ect Mult Living   1              # Outc Date GA Lbr Len/2nd Wgt Sex Del Anes PTL Lv   1 CUR              Past Surgical History  Procedure Date  . No past surgeries    History   Social History  . Marital Status: Single    Spouse Name: N/A    Number of Children: N/A  . Years of Education: N/A   Occupational History  . Not on file.   Social History Main Topics  . Smoking status: Never Smoker   . Smokeless tobacco: Never Used  . Alcohol Use: No  . Drug Use: No  . Sexually Active: Yes   Other Topics Concern  . Not on file   Social History Narrative  . No narrative on file   No current facility-administered medications on file prior to encounter.   Current Outpatient Prescriptions on File Prior to Encounter  Medication Sig Dispense Refill  . Prenatal Vit-Fe Fumarate-FA (MULTIVITAMIN-PRENATAL) 27-0.8 MG TABS Take 1 tablet by mouth daily with breakfast.       No Known Allergies  ROS: Pertinent items in HPI  OBJECTIVE Blood pressure 124/83, pulse 92, temperature 98.9 F (37.2 C), temperature source Oral, resp. rate 18, height 5\' 4"  (1.626 m), weight 73.211 kg (161 lb 6.4 oz), last menstrual period 04/18/2011. GENERAL: Well-developed, well-nourished female in no acute distress.  HEENT: Normocephalic HEART: normal rate RESP: normal effort ABDOMEN: Soft, non-tender EXTREMITIES:  Nontender, no edema NEURO: Alert and oriented SPECULUM EXAM: NEFG , cervix appears 1-2 cm dilated with membranes flush with the external os which appears to be completely effaced. Minimal mucus in the vaginal vault. No blood. BIMANUAL: deferred  LAB RESULTS No results found for this or any previous visit (from the past 24 hour(s)).  IMAGING No results found.  ED COURSE  Trendelenburg and Foley  ASSESSMENT Preterm cervical change in G1 at [redacted]w[redacted]d  PLAN Consult Dr. Clearance Burnett: Admit for MgSO4, steroids, abx, BR    Jennifer Burnett, CNM 09/30/2011  11:44 AM

## 2011-10-01 NOTE — Progress Notes (Signed)
Patient ID: SHAZIA WEHRLY, female   DOB: [birth year], 26 y.o.   MRN: [id number] Hospital Day: 2  S: Preterm labor symptoms: None  O: Blood pressure 117/74, pulse 96, temperature 98.7 F (37.1 C), temperature source Oral, resp. rate 14, height 5\' 4"  (1.626 m), weight 73.211 kg (161 lb 6.4 oz), last menstrual period 04/18/2011, SpO2 98.00%.   WUJ:WJXBJYNW: 140 bpm Toco: No uterine contractions. GNF:AOZHYQMV: 1 (visual) Exam by:: Poe  A/P- 26 y.o. admitted with: Preterm cervical changes.  Probable cervical incompetence.  Stable.  Continue bedrest.  MFM consult tomorrow.  Patient Active Hospital Problem List: No active hospital problems.   Pregnancy Complications: Preterm cervical changes.  Preterm labor management: bedrest advised Dating:  [redacted]w[redacted]d PNL Needed:  none FWB:  good PTL:  stable

## 2011-10-02 ENCOUNTER — Encounter (HOSPITAL_COMMUNITY): Payer: Medicaid Other

## 2011-10-02 ENCOUNTER — Inpatient Hospital Stay (HOSPITAL_COMMUNITY): Payer: Medicaid Other

## 2011-10-02 DIAGNOSIS — O4700 False labor before 37 completed weeks of gestation, unspecified trimester: Secondary | ICD-10-CM | POA: Diagnosis present

## 2011-10-02 MED ORDER — SODIUM CHLORIDE 0.9 % IJ SOLN
3.0000 mL | Freq: Two times a day (BID) | INTRAMUSCULAR | Status: DC
  Administered 2011-10-02 – 2011-10-20 (×38): 3 mL via INTRAVENOUS

## 2011-10-02 MED ORDER — PROGESTERONE MICRONIZED 200 MG PO CAPS
200.0000 mg | ORAL_CAPSULE | Freq: Every day | ORAL | Status: DC
  Administered 2011-10-02 – 2011-10-18 (×17): 200 mg via VAGINAL
  Filled 2011-10-02 (×18): qty 1

## 2011-10-02 NOTE — Progress Notes (Signed)
Patient ID: Jennifer Burnett, female   DOB: 1985/04/05, 26 y.o.   MRN: [id number] Hospital Day: 3  S: Preterm labor symptoms: none  O: Blood pressure 119/73, pulse 83, temperature 98.3 F (36.8 C), temperature source Oral, resp. rate 17, height 5\' 4"  (1.626 m), weight 73.211 kg (161 lb 6.4 oz), last menstrual period 04/18/2011, SpO2 98.00%.   FHT:150 Toco: None WUJ:WJXBJYNW: Fingertip Effacement (%): 50 Cervical Position: Middle Exam by:: Poe  A/P- 26 y.o. admitted with:  Patient Active Hospital Problem List: Threatened preterm labor (10/02/2011)   Assessment: Cervical exam stable   Plan: MFM consult/Progesterone vaginally    Preterm labor management: D/C MgSO4 Dating:  [redacted]w[redacted]d

## 2011-10-02 NOTE — Progress Notes (Signed)
Monitors removed. Pt to mfm

## 2011-10-02 NOTE — Consult Note (Signed)
Maternal Fetal Medicine Consultation  Requesting Provider(s): Coral Ceo, MD  Reason for consultation: "preterm cervical changes"  HPI: Jennifer Burnett is a 26 yo G1P0 currently at 52 6/7 weeks admitted for preterm labor / suspected incompetent cervix.  She reports noting increased vaginal discharge over the weekend - was seen in the MAU on Sunday and noted to have "an open cervix".  She completed a 24 hour course of Magnesium sulfate and betamethasone for fetal lung maturity and was treated with a 48 hour course of Ampicillin and Azithromycin.  She is currently on bedrest.  She denies uterine contractions, vaginal bleeding or leakage of fluid.  The fetus is active.  OB History: OB History    Grav Para Term Preterm Abortions TAB SAB Ect Mult Living   1               PMH:  Past Medical History  Diagnosis Date  . Sickle cell trait   . PID (acute pelvic inflammatory disease)     as teenager    PSH:  Past Surgical History  Procedure Date  . No past surgeries    Meds: PNV, Vitamin D supplements, Prometrium (intravaginally) 200 mg daily Allergies: NKDA FH: Denies family hx of birth defects or hereditary disorders Soc: Denies ETOH, tobacco or illicit drug use  Review of Systems: no vaginal bleeding or cramping/contractions, no LOF, no nausea/vomiting. All other systems reviewed and are negative.    PE:  Filed Vitals:   10/02/11 1217  BP: 112/66  Pulse: 74  Temp: 98.5 F (36.9 C)  Resp: 18   GEN: well-appearing female ABD: gravid, NT  Ultrasound:  Single IUP at 23 6/7 weeks Normal anatomic fetal survey Fetal growth is appropriate (646 g / 54th %tile) Normal amniotic fluid volume No markers associated with aneuploidy noted  TVUS - fetal membranes noted beyond the cervix (into vagina).  Some debris noted in the amniotic sac.  CBC    Component Value Date/Time   WBC 13.4* 09/30/2011 1315   RBC 4.48 09/30/2011 1315   HGB 13.1 09/30/2011 1315   HCT 38.2 09/30/2011  1315   PLT 241 09/30/2011 1315   MCV 85.3 09/30/2011 1315   MCH 29.2 09/30/2011 1315   MCHC 34.3 09/30/2011 1315   RDW 14.2 09/30/2011 1315   LYMPHSABS 1.6 06/20/2011 1254   MONOABS 1.2* 06/20/2011 1254   EOSABS 0.2 06/20/2011 1254   BASOSABS 0.0 06/20/2011 1254      A/P: 1) Shortened cervix / hour-glassing membranes - likely cervical insufficiency.  Given current gestational age and exam, not a candidate for rescue cerclage.  Concur with Betamethasone, Vaginal progesterone supplements.  Based on exam, the patient is at significant risk for preterm delivery and would expect delivery within the next 1-2 weeks.  We briefly discussed incompetent cervix - would recommend either prophylactic cerclage in future pregnancies or cervical length surveillance beginning at [redacted] weeks gestation and cerclage if cervical shortening noted.  Rec: 1) NICU consult  2) Continued bedrest / observation 3) Given exam, the patient will likely experience premature rupture of membranes.  Would manage expectantly - consider re-dosing antibiotics (Ampicillin / Azithromycin) for latency with a 48 hour course of IV antibiotics followed by a oral Amoxicillin x 7 days if rupture of membranes occurs 4) If clinical course changes and delivery appears imminent, would consider resuming Magnesium sulfate for a 12 hour course for neuoprophylaxis only (not tocolysis)   Thank you for the opportunity to be a part of the  care of Jennifer Burnett. Please contact our office if we can be of further assistance.   I spent approximately 30 minutes with this patient with over 50% of time spent in face-to-face counseling.  Alpha Gula, MD

## 2011-10-02 NOTE — Progress Notes (Signed)
Ur chart review completed.  

## 2011-10-03 MED ORDER — AZITHROMYCIN 500 MG PO TABS
1000.0000 mg | ORAL_TABLET | Freq: Once | ORAL | Status: AC
  Administered 2011-10-03: 1000 mg via ORAL
  Filled 2011-10-03: qty 2

## 2011-10-03 MED ORDER — AZITHROMYCIN 500 MG PO TABS
500.0000 mg | ORAL_TABLET | Freq: Every day | ORAL | Status: DC
  Filled 2011-10-03: qty 1

## 2011-10-03 MED ORDER — SODIUM CHLORIDE 0.9 % IV SOLN
2.0000 g | Freq: Four times a day (QID) | INTRAVENOUS | Status: DC
  Administered 2011-10-03 – 2011-10-04 (×4): 2 g via INTRAVENOUS
  Filled 2011-10-03 (×6): qty 2000

## 2011-10-03 MED ORDER — RHO D IMMUNE GLOBULIN 1500 UNIT/2ML IJ SOLN
300.0000 ug | Freq: Once | INTRAMUSCULAR | Status: DC
  Filled 2011-10-03: qty 2

## 2011-10-03 NOTE — Progress Notes (Signed)
Patient ID: PIYA MESCH, female   DOB: 15-Aug-1985, 26 y.o.   MRN: [id number] Hospital Day: 4  S: Preterm labor symptoms: none  O: Blood pressure 122/72, pulse 74, temperature 97.9 F (36.6 C), temperature source Oral, resp. rate 18, height 5\' 4"  (1.626 m), weight 73.211 kg (161 lb 6.4 oz), last menstrual period 04/18/2011, SpO2 98.00%.   WUJ:WJXBJYNW: 150 bpm Toco: None GNF:AOZHYQMV: Fingertip Effacement (%): 50 Cervical Position: Middle Exam by:: dr Tamela Oddi  A/P- 26 y.o. admitted with:  Preterm cervical changes.  Stable.  Continue bedrest.    Pregnancy Complications: None  Preterm labor management: bedrest advised Dating:  [redacted]w[redacted]d PNL Needed:  none FWB:  good PTL:  stable

## 2011-10-03 NOTE — Progress Notes (Signed)
MD aware of strip - reassuring now -  Monitors removed.

## 2011-10-04 MED ORDER — LACTATED RINGERS IV SOLN
INTRAVENOUS | Status: DC
  Administered 2011-10-04: 07:00:00 via INTRAVENOUS

## 2011-10-04 MED ORDER — MAGNESIUM SULFATE 40 G IN LACTATED RINGERS - SIMPLE
2.0000 g/h | INTRAVENOUS | Status: DC
  Administered 2011-10-04: 2 g/h via INTRAVENOUS
  Administered 2011-10-04: 4 g/h via INTRAVENOUS
  Filled 2011-10-04: qty 500

## 2011-10-04 MED ORDER — MAGNESIUM SULFATE BOLUS VIA INFUSION
4.0000 g | Freq: Once | INTRAVENOUS | Status: AC
  Administered 2011-10-04: 4 g via INTRAVENOUS
  Filled 2011-10-04: qty 500

## 2011-10-04 NOTE — Progress Notes (Signed)
Patient ID: Jennifer Burnett, female   DOB: [birth year], 26 y.o.   MRN: [id number] Hospital Day: 5  S: Preterm labor symptoms: none  O: Blood pressure 106/53, pulse 94, temperature 98.5 F (36.9 C), temperature source Oral, resp. rate 18, height 5\' 4"  (1.626 m), weight 73.211 kg (161 lb 6.4 oz), last menstrual period 04/18/2011, SpO2 98.00%.   WUJ:WJXBJYNW: 150 bpm Toco: None GNF:AOZHYQMV: Fingertip Effacement (%): 50 Cervical Position: Middle Exam by:: dr Tamela Oddi  A/P- 26 y.o. admitted with: Preterm cervical changes.  Stable.  Continue bedrest.    Pregnancy Complications: None  Preterm labor management: bedrest advised Dating:  [redacted]w[redacted]d PNL Needed:  none FWB:  good PTL:  stable

## 2011-10-04 NOTE — Progress Notes (Signed)
Patient ID: Jennifer Burnett, female   DOB: 12/19/1985, 26 y.o.   MRN: [id number] Hospital Day: 5  S: Preterm labor symptoms: none  O: Blood pressure 118/74, pulse 85, temperature 98.4 F (36.9 C), temperature source Oral, resp. rate 18, height 5\' 4"  (1.626 m), weight 73.211 kg (161 lb 6.4 oz), last menstrual period 04/18/2011, SpO2 98.00%.   QQV:ZDGLOVFI: 150 bpm Toco: None EPP:IRJJOACZ: Fingertip Effacement (%): 50 Cervical Position: Middle Exam by:: dr Tamela Oddi  A/P- 26 y.o. admitted with: Preterm cervical changes.  Stable.  Continue bedrest.  Pregnancy Complications: None  Preterm labor management: bedrest advised Dating:  [redacted]w[redacted]d PNL Needed:  none FWB:  good PTL:  stable

## 2011-10-04 NOTE — Progress Notes (Signed)
Magnesium 4 gm bolus started per md order. Instructed pt to stay in bed.

## 2011-10-05 NOTE — Progress Notes (Signed)
Patient ID: Jennifer Burnett, female   DOB: 11/22/1985, 26 y.o.   MRN: [id number] Hospital Day: 6  S: Preterm labor symptoms: none  O: Blood pressure 123/71, pulse 78, temperature 97.8 F (36.6 C), temperature source Oral, resp. rate 18, height 5\' 4"  (1.626 m), weight 72.938 kg (160 lb 12.8 oz), last menstrual period 04/18/2011, SpO2 98.00%.   NWG:NFAOZHYQ: 150 bpm Toco: None MVH:QIONGEXB: Fingertip Effacement (%): 50 Cervical Position: Middle Exam by:: dr Tamela Oddi  A/P- 26 y.o. admitted with:  Preterm cervical changes.  Stable.  Continue bedrest.    Pregnancy Complications: None  Preterm labor management: bedrest advised Dating:  [redacted]w[redacted]d PNL Needed:  none FWB:  good PTL:  stable

## 2011-10-05 NOTE — Progress Notes (Signed)
UR Chart review completed.  

## 2011-10-05 NOTE — Progress Notes (Signed)
Patient ID: MAKAILYN MCCORMICK, female   DOB: Dec 23, 1985, 26 y.o.   MRN: [id number] Hospital Day: 6  S: Preterm labor symptoms: none  O: Blood pressure 123/71, pulse 78, temperature 97.8 F (36.6 C), temperature source Oral, resp. rate 18, height 5\' 4"  (1.626 m), weight 72.938 kg (160 lb 12.8 oz), last menstrual period 04/18/2011, SpO2 98.00%.   WGN:FAOZHYQM: 150 bpm Toco: None SVE: deferred A/P- 26 y.o. admitted with: Preterm cervical changes.  Stable.  Continue bedrest. NICU consult  Pregnancy Complications: None  Preterm labor management: bedrest advised Dating:  [redacted]w[redacted]d PNL Needed:  2 hr GTT FWB:  good PTL:  stable

## 2011-10-06 MED ORDER — INDOMETHACIN 25 MG PO CAPS
25.0000 mg | ORAL_CAPSULE | Freq: Four times a day (QID) | ORAL | Status: AC
  Administered 2011-10-07 – 2011-10-08 (×8): 25 mg via ORAL
  Filled 2011-10-06 (×8): qty 1

## 2011-10-06 MED ORDER — INDOMETHACIN 50 MG RE SUPP
50.0000 mg | Freq: Once | RECTAL | Status: AC
  Administered 2011-10-06: 50 mg via RECTAL
  Filled 2011-10-06: qty 1

## 2011-10-06 NOTE — Progress Notes (Signed)
Patient ID: ANGIELINA RANCE, female   DOB: 01/19/1985, 26 y.o.   MRN: [id number] Hospital Day: 7  S: Preterm labor symptoms: none  O: Blood pressure 117/66, pulse 95, temperature 98.1 F (36.7 C), temperature source Oral, resp. rate 18, height 5\' 4"  (1.626 m), weight 72.938 kg (160 lb 12.8 oz), last menstrual period 04/18/2011, SpO2 98.00%.   BMW:UXLKGMWN: 150 bpm Toco: None UUV:OZDGUYQI: Fingertip Effacement (%): 50 Cervical Position: Middle Exam by:: dr Tamela Oddi  A/P- 26 y.o. admitted with:  Preterm cervical changes.  Stable.  Continue bedrest.    Pregnancy Complications: preterm labor   Preterm labor management: bedrest advised Dating:  [redacted]w[redacted]d PNL Needed:  none FWB:  good PTL:  stable

## 2011-10-08 NOTE — Progress Notes (Signed)
Patient ID: Jennifer Burnett, female   DOB: 29-Aug-1985, 26 y.o.   MRN: [id number] Bile signs normal Status unchanged No complaints

## 2011-10-09 ENCOUNTER — Encounter (HOSPITAL_COMMUNITY): Payer: Self-pay | Admitting: *Deleted

## 2011-10-09 NOTE — Progress Notes (Signed)
Ur chart review completed.  

## 2011-10-09 NOTE — Progress Notes (Signed)
Malone was in good spirits during our visit.  She has good support from family, and is able to relax while she is here.  She is taking classes on-line and that keeps her busy.    She seems to be coping well with the situation.  I offered compassionate listening and pastoral presence.  Agnes Lawrence Arav Bannister 9:55 Pager, [phone]   10/09/11 1000  Clinical Encounter Type  Visited With Patient  Visit Type Initial  Referral From Nurse

## 2011-10-09 NOTE — Consult Note (Addendum)
Neonatology Consult Note:  At the request of the patients obstetrician Dr. Clearance Coots I met with Jennifer Burnett  who is at 24 6 wks currently with preg complicated by PTL and hypertension.  S/p magnesium sulfate (9/14-16) and BMZ (9/14 -15).      We discussed morbidity/mortality at this gestional age, delivery room resuscitation, including intubation and surfactant in DR.  Discussed mechanical ventilation and risk for chronic lung disease, risk for IVH with potential for motor / cognitive deficits, ROP, NEC, sepsis, as well as temperature instability and feeding immaturity.  Discussed NG / OG feeds, benefits of MBM in reducing incidence of NEC.   Discussed likely length of stay. Thank you for allowing [place] to participate in her care.  Please call with questions.  John Giovanni, DO  Neonatologist Face to face time 25 min.

## 2011-10-09 NOTE — Progress Notes (Signed)
Patient ID: Jennifer Burnett, female   DOB: [birth year], 26 y.o.   MRN: [id number] Hospital Day: 11  S: Preterm labor symptoms: vaginal discharge  O: Blood pressure 116/70, pulse 95, temperature 98.1 F (36.7 C), temperature source Oral, resp. rate 18, height 5\' 4"  (1.626 m), weight 72.938 kg (160 lb 12.8 oz), last menstrual period 04/18/2011, SpO2 98.00%.   WUJ:WJXBJYNW: 150 bpm Toco: None GNF:AOZHYQMV: Fingertip Effacement (%): 50 Cervical Position: Middle Exam by:: dr Tamela Oddi  A/P- 26 y.o. admitted with:  Preterm cervical changes.  Stable.  Continue bedrest.    Pregnancy Complications: hypertension  Preterm labor management: bedrest advised Dating:  [redacted]w[redacted]d PNL Needed:  none FWB:  good PTL:  stable

## 2011-10-09 NOTE — Progress Notes (Signed)
24 6/[redacted] weeks gestation, with PTL, cervical changes.  Height  64 " Weight 160 Lbs,  pre-pregnancy weight approx 134 Lbs.Pre-pregnancy  BMI 23  IBW 120 Lbs  Total weight gain 26 Lbs. Weight gain goals 25-35 Lbs.   Estimated needs: 16-1800 kcal/day, 60-70 grams protein/day, 2 liters fluid/day regular diet tolerated well, appetite good. Current diet prescription will provide for increased needs. No abnormal nutrition related labs  Nutrition Dx: Increased nutrient needs r/t pregnancy and fetal growth requirements aeb [redacted] weeks gestation.  No educational needs assessed at this time.  Elisabeth Cara M.Odis Luster LDN Neonatal Nutrition Support Specialist Pager [phone]

## 2011-10-10 ENCOUNTER — Inpatient Hospital Stay (HOSPITAL_COMMUNITY): Payer: Medicaid Other

## 2011-10-11 NOTE — Progress Notes (Signed)
Patient ID: Jennifer Burnett, female   DOB: [birth year], 26 y.o.   MRN: [id number] Hospital Day: 47  S: Preterm labor symptoms: none  O: Blood pressure 122/75, pulse 101, temperature 98.2 F (36.8 C), temperature source Oral, resp. rate 18, height 5\' 4"  (1.626 m), weight 72.938 kg (160 lb 12.8 oz), last menstrual period 04/18/2011, SpO2 98.00%.   WUJ:WJXBJYNW: 140-150 bpm Toco: None GNF:AOZHYQMV: Fingertip Effacement (%): 50 Cervical Position: Middle Exam by:: dr Tamela Oddi  A/P- 26 y.o. admitted with: Preterm cervical changes.  Stable.  Continue bedrest.    Pregnancy Complications: None  Preterm labor management: bedrest advised and pelvic rest advised Dating:  [redacted]w[redacted]d PNL Needed:  none FWB:  good PTL:  stable

## 2011-10-12 NOTE — Progress Notes (Signed)
UR Chart review completed.  

## 2011-10-12 NOTE — Progress Notes (Signed)
Patient ID: MAYLING ABER, female   DOB: 03-19-1985, 26 y.o.   MRN: [id number] Hospital Day: 44  S: Preterm labor symptoms: None O: Blood pressure 109/70, pulse 93, temperature 98.5 F (36.9 C), temperature source Oral, resp. rate 20, height 5\' 4"  (1.626 m), weight 72.938 kg (160 lb 12.8 oz), last menstrual period 04/18/2011, SpO2 98.00%.   WUJ:WJXBJYNW: 150 bpm Toco: None GNF:AOZHYQMV: Fingertip Effacement (%): 50 Cervical Position: Middle Exam by:: dr Tamela Oddi  A/P- 26 y.o. admitted with:  Preterm cervical changes.  Stable.  Continue bedrest.    Pregnancy Complications: None  Preterm labor management: bedrest advised Dating:  [redacted]w[redacted]d PNL Needed:  none FWB:  good PTL:  stable

## 2011-10-13 NOTE — Progress Notes (Signed)
Patient ID: Jennifer Burnett, female   DOB: 1985/10/25, 26 y.o.   MRN: [id number] Hospital Day: 26  S: Preterm labor symptoms: none  O: Blood pressure 103/65, pulse 100, temperature 99 F (37.2 C), temperature source Oral, resp. rate 24, height 5\' 4"  (1.626 m), weight 72.938 kg (160 lb 12.8 oz), last menstrual period 04/18/2011, SpO2 98.00%.   HYQ:MVHQIONG: 150 bpm Toco: None EXB:MWUXLKGM: Fingertip Effacement (%): 50 Cervical Position: Middle Exam by:: dr Tamela Oddi  A/P- 26 y.o. admitted with:  Preterm cervical changes.  Stable.  Continue bedrest.    Pregnancy Complications: None  Preterm labor management: bedrest advised Dating:  [redacted]w[redacted]d PNL Needed:  none FWB:  good PTL:  stable

## 2011-10-14 NOTE — Progress Notes (Signed)
Patient ID: Jennifer Burnett, female   DOB: 1985-08-09, 26 y.o.   MRN: [id number] Hospital Day: 9  S: Preterm labor symptoms: none  O: Blood pressure 128/77, pulse 94, temperature 98.4 F (36.9 C), temperature source Oral, resp. rate 18, height 5\' 4"  (1.626 m), weight 72.938 kg (160 lb 12.8 oz), last menstrual period 04/18/2011, SpO2 98.00%.   NWG:NFAOZHYQ: 150 bpm Toco: None MVH:QIONGEXB: Fingertip Effacement (%): 50 Cervical Position: Middle Exam by:: dr Tamela Oddi  A/P- 26 y.o. admitted with: Preterm cervical changes.  Stable.  Continue bedrest.    Pregnancy Complications: Preterm cervical changes  Preterm labor management: bedrest advised Dating:  [redacted]w[redacted]d PNL Needed:  none FWB:  good PTL:  none

## 2011-10-16 NOTE — Progress Notes (Signed)
Patient ID: MALAYSHA ARLEN, female   DOB: Dec 21, 1985, 26 y.o.   MRN: [id number] Hospital Day: 23  S: Preterm labor symptoms: Preterm cervical funneling and dilation.  O: Blood pressure 112/74, pulse 92, temperature 98.2 F (36.8 C), temperature source Oral, resp. rate 18, height 5\' 4"  (1.626 m), weight 72.938 kg (160 lb 12.8 oz), last menstrual period 04/18/2011, SpO2 98.00%.   HQI:ONGEXBMW: 150 bpm Toco: None UXL:KGMWNUUV: Fingertip Effacement (%): 50 Cervical Position: Middle Exam by:: dr Tamela Oddi  A/P- 26 y.o. admitted with: Preterm cervical changes on U/S.  Stable.  Continue bedrest.    Pregnancy Complications: None  Preterm labor management: bedrest advised Dating:  [redacted]w[redacted]d PNL Needed:  none FWB:  good PTL:  stable

## 2011-10-17 NOTE — Progress Notes (Signed)
Report given to Williemae Natter RN to cover

## 2011-10-17 NOTE — Progress Notes (Signed)
Patient ID: Jennifer Burnett, female   DOB: Feb 09, 1985, 26 y.o.   MRN: [id number] Hospital Day: 61  S: Preterm labor symptoms: Preterm cervical changes.  O: Blood pressure 113/68, pulse 91, temperature 98.5 F (36.9 C), temperature source Oral, resp. rate 18, height 5\' 4"  (1.626 m), weight 72.938 kg (160 lb 12.8 oz), last menstrual period 04/18/2011, SpO2 98.00%.   WUJ:WJXBJYNW: 150 bpm Toco: None GNF:AOZHYQMV: Fingertip Effacement (%): 50 Cervical Position: Middle Exam by:: dr Tamela Oddi  A/P- 26 y.o. admitted with:  Preterm cervical funneling, dilation and doming of membranes.  Stable on bedrest.  Continue.    Pregnancy Complications: preterm labor   Preterm labor management: bedrest advised Dating:  [redacted]w[redacted]d PNL Needed:  none FWB:  good PTL:  stable

## 2011-10-17 NOTE — Progress Notes (Signed)
Marrion Coy [facility] covering pt care now

## 2011-10-17 NOTE — Progress Notes (Signed)
UR Chart review completed.  

## 2011-10-18 NOTE — Progress Notes (Signed)
Patient ID: Jennifer Burnett, female   DOB: 01-16-1986, 26 y.o.   MRN: [id number] Hospital Day: 47  S: Preterm labor symptoms: vaginal discharge  O: Blood pressure 110/67, pulse 99, temperature 98.1 F (36.7 C), temperature source Oral, resp. rate 18, height 5\' 4"  (1.626 m), weight 72.938 kg (160 lb 12.8 oz), last menstrual period 04/18/2011, SpO2 98.00%.   WUJ:WJXBJYNW: 150 bpm Toco: None GNF:AOZHYQMV: Fingertip Effacement (%): 50 Cervical Position: Middle Exam by:: dr Tamela Oddi  A/P- 26 y.o. admitted with:  Preterm cervical changes.  Stable on bedrest.  Continue.    Pregnancy Complications: None  Preterm labor management: bedrest advised Dating:  [redacted]w[redacted]d PNL Needed:  none FWB:  good PTL:  stable

## 2011-10-19 NOTE — Progress Notes (Signed)
Pt called out with c/o leakage of fluid after going to bsc. Denies any further leakage of fluid. Fern negative. Denies any vaginal bleeding of further leakage of fluid. Instructed pt to notify nurse of any further leakage.

## 2011-10-19 NOTE — Progress Notes (Signed)
Notified dr Clearance Coots of positive amnisure. No new orders received.

## 2011-10-19 NOTE — Progress Notes (Signed)
Dr Clearance Coots called and notified of ? Leakage of fluid after voiding. Fern negative. Will order amnisure.

## 2011-10-19 NOTE — Progress Notes (Signed)
Patient ID: Jennifer Burnett, female   DOB: 1985/08/27, 26 y.o.   MRN: [id number] Hospital Day: 60  S: Preterm labor symptoms: vaginal discharge  O: Blood pressure 117/72, pulse 102, temperature 98.5 F (36.9 C), temperature source Oral, resp. rate 18, height 5\' 4"  (1.626 m), weight 73.12 kg (161 lb 3.2 oz), last menstrual period 04/18/2011, SpO2 98.00%.   HYQ:MVHQIONG: 150 bpm Toco: None EXB:MWUXLKGM: Fingertip Effacement (%): 50 Cervical Position: Middle Exam by:: dr Tamela Oddi  A/P- 26 y.o. admitted with:  Preterm cervical changes.  Stable.  Continue bedrest.    Pregnancy Complications: None  Preterm labor management: bedrest advised Dating:  [redacted]w[redacted]d PNL Needed:  none FWB:  god PTL:  stable

## 2011-10-20 ENCOUNTER — Inpatient Hospital Stay (HOSPITAL_COMMUNITY): Payer: Medicaid Other

## 2011-10-20 LAB — GLUCOSE, 2 HOUR: Glucose, 2 hour: 176 mg/dL — ABNORMAL HIGH (ref 70–139)

## 2011-10-20 LAB — GLUCOSE, FASTING: Glucose, Fasting: 96 mg/dL (ref 70–99)

## 2011-10-20 MED ORDER — MAGNESIUM SULFATE 40 G IN LACTATED RINGERS - SIMPLE
2.0000 g/h | INTRAVENOUS | Status: AC
  Administered 2011-10-21 – 2011-10-22 (×2): 2 g/h via INTRAVENOUS
  Filled 2011-10-20 (×3): qty 500

## 2011-10-20 MED ORDER — BETAMETHASONE SOD PHOS & ACET 6 (3-3) MG/ML IJ SUSP
12.0000 mg | Freq: Every day | INTRAMUSCULAR | Status: AC
  Administered 2011-10-20 – 2011-10-21 (×2): 12 mg via INTRAMUSCULAR
  Filled 2011-10-20 (×2): qty 2

## 2011-10-20 MED ORDER — AZITHROMYCIN 500 MG PO TABS
500.0000 mg | ORAL_TABLET | Freq: Once | ORAL | Status: AC
  Administered 2011-10-20: 500 mg via ORAL
  Filled 2011-10-20: qty 1

## 2011-10-20 MED ORDER — SODIUM CHLORIDE 0.9 % IV SOLN
2.0000 g | Freq: Four times a day (QID) | INTRAVENOUS | Status: AC
  Administered 2011-10-20 – 2011-10-25 (×20): 2 g via INTRAVENOUS
  Filled 2011-10-20 (×20): qty 2000

## 2011-10-20 MED ORDER — LACTATED RINGERS IV SOLN
INTRAVENOUS | Status: DC
  Administered 2011-10-20 – 2011-10-22 (×5): via INTRAVENOUS

## 2011-10-20 MED ORDER — MAGNESIUM SULFATE BOLUS VIA INFUSION
4.0000 g | Freq: Once | INTRAVENOUS | Status: AC
  Administered 2011-10-20: 4 g via INTRAVENOUS
  Filled 2011-10-20: qty 500

## 2011-10-20 MED ORDER — AZITHROMYCIN 250 MG PO TABS
250.0000 mg | ORAL_TABLET | Freq: Every day | ORAL | Status: DC
  Administered 2011-10-21 – 2011-10-30 (×10): 250 mg via ORAL
  Filled 2011-10-20 (×12): qty 1

## 2011-10-20 NOTE — Consult Note (Signed)
Maternal Fetal Medicine Consultation  Requesting Provider(s): Coral Ceo, MD   Reason for consultation: PROM at 26+ weeks  HPI: Jennifer Burnett is a 26 yo G1P0 currently at 81 3/7 weeks initially admitted for preterm labor / suspected incompetent cervix.  She completed a 24 hour course of Magnesium sulfate and betamethasone for fetal lung maturity and was treated with a course of Ampicillin and Azithromycin. (see my consult from 9/16)    The patient reports PROM last night (clear fluid)  - confirmed by [facility] test.  Asked to give further recommendations for management based on this new finding.  She denies contractions/ cramping, vaginal bleeding. The fetus is active.    OB History: OB History    Grav Para Term Preterm Abortions TAB SAB Ect Mult Living   1 0 0 0 0 0 0 0 0 0       PMH:  Past Medical History  Diagnosis Date  . Sickle cell trait   . PID (acute pelvic inflammatory disease)     as teenager    PSH:  Past Surgical History  Procedure Date  . No past surgeries    Meds: PNV, Ambien, TUMS, intravaginal prometrium  Allergies: No Known Allergies  FH: neg for birth defects/ hereditary disorders  Soc: Denies tobacco, ETOH or illicit drug use  Review of Systems: no vaginal bleeding or cramping/contractions, no nausea/vomiting. All other systems reviewed and are negative.   PE:   Filed Vitals:   10/20/11 1700  BP: 122/78  Pulse: 113  Temp:   Resp: 18    GEN: well-appearing female ABD: gravid, NT  Ultrasound: Single IUP at 26 3/7 weeks Suspected cervical insufficiency, PPROM [name] Interval growth appropriate (38th %tile)    Labs: CBC    Component Value Date/Time   WBC 13.4* 09/30/2011 1315   RBC 4.48 09/30/2011 1315   HGB 13.1 09/30/2011 1315   HCT 38.2 09/30/2011 1315   PLT 241 09/30/2011 1315   MCV 85.3 09/30/2011 1315   MCH 29.2 09/30/2011 1315   MCHC 34.3 09/30/2011 1315   RDW 14.2 09/30/2011 1315   LYMPHSABS 1.6 06/20/2011  1254   MONOABS 1.2* 06/20/2011 1254   EOSABS 0.2 06/20/2011 1254   BASOSABS 0.0 06/20/2011 1254     A/P: 1) PPROM at 26 3/7 weeks - recommend resuming latency antibiotics - Azithromycin (500 mg now; 250 mg daily x 3 days) and IV ampicillin x 72 hours followed by 4 day course of Amoxicillin.  Patient received a course of betamethasone on admission - now > 3 weeks ago - concur with single repeat course of betamethasone.  If delivery appears imminent and prior to [redacted] weeks gestation, would give a 12-hr course of Magnesium sulfate for neuroprophylaxis (not tocolysis).  Also recommend: 1) BID fetal strips/ NSTs 2) 2x weekly BPPs with MFM 3) Weekly CBCs -  Follow WBC counts as possible early sign of intraamniotic infection 4) Would manage expectantly - move toward delivery for signs/ symptoms of chorioamnionitis or fetal compromise.  Breech presentation by ultrasound today - C-section for delivery if remains breech 5) If otherwise stable - delivery at [redacted] weeks gestation.  Thank you for the opportunity to be a part of the care of Jennifer Burnett. Please contact our office if we can be of further assistance.   I spent approximately 15 minutes with this patient with over 50% of time spent in face-to-face counseling.  Alpha Gula, MD

## 2011-10-20 NOTE — Progress Notes (Signed)
Patient ID: Jennifer Burnett, female   DOB: 06-20-1985, 26 y.o.   MRN: [id number] Hospital Day: 69  S: Preterm labor symptoms: {Preterm cervical changes  O: Blood pressure 124/71, pulse 115, temperature 98.6 F (37 C), temperature source Oral, resp. rate 18, height 5\' 4"  (1.626 m), weight 73.12 kg (161 lb 3.2 oz), last menstrual period 04/18/2011, SpO2 98.00%.   WGN:FAOZHYQM: 150 bpm Toco: None VHQ:IONGEXBM: Fingertip Effacement (%): 50 Cervical Position: Middle Exam by:: dr Tamela Oddi  A/P- 26 y.o. admitted with:  Preterm cervical dilation and doming of membranes.  PPROM overnight.  Stable.  Continue bedrest.  MFM consult.    Pregnancy Complications: irregular UC's  Preterm labor management: bedrest advised Dating:  [redacted]w[redacted]d PNL Needed:  none FWB:  good PTL:  stable

## 2011-10-20 NOTE — Progress Notes (Signed)
Monitors removed. Ultrasound in room.

## 2011-10-20 NOTE — Progress Notes (Signed)
UR Chart review completed.  

## 2011-10-21 DIAGNOSIS — O4290 Premature rupture of membranes, unspecified as to length of time between rupture and onset of labor, unspecified weeks of gestation: Secondary | ICD-10-CM | POA: Diagnosis not present

## 2011-10-21 LAB — GLUCOSE, CAPILLARY: Glucose-Capillary: 168 mg/dL — ABNORMAL HIGH (ref 70–99)

## 2011-10-21 LAB — CBC
HCT: 33 % — ABNORMAL LOW (ref 36.0–46.0)
Hemoglobin: 11.3 g/dL — ABNORMAL LOW (ref 12.0–15.0)
MCHC: 34.2 g/dL (ref 30.0–36.0)
RBC: 3.97 MIL/uL (ref 3.87–5.11)

## 2011-10-21 NOTE — Progress Notes (Signed)
Patient ID: Jennifer Burnett, female   DOB: 01/10/1986, 26 y.o.   MRN: [id number] Hospital Day: 54  S: Preterm labor symptoms: no complaints  O: Blood pressure 105/67, pulse 100, temperature 98.4 F (36.9 C), temperature source Oral, resp. rate 18, height 5\' 4"  (1.626 m), weight 73.12 kg (161 lb 3.2 oz), last menstrual period 04/18/2011, SpO2 98.00%.   QMV:HQIONGEX: 150 bpm, Variability: Good {> 6 bpm) and Decelerations: Absent Toco: None BMW:UXLKGMWN: Fingertip Effacement (%): 50 Cervical Position: Middle Exam by:: dr Tamela Oddi  A/P- 26 y.o. admitted with:  Patient Active Hospital Problem List: Threatened preterm labor (10/02/2011)   Assessment: Stable   Plan: continue bedrest PROM (premature rupture of membranes) (10/21/2011)   Assessment: No over signs, symptoms of infection   Plan: per MFM; CBC today-->weekly ?GDM--2 hr GTT abnormal; ?spurious results, pt was not fasting Follow CBGs   Pregnancy Complications: see above  Preterm labor management: MgSO4 until steroid course completed Dating:  [redacted]w[redacted]d  FWB:  Category I PTL:  See above ROD: C-section with labor; if remains breech

## 2011-10-22 LAB — GLUCOSE, CAPILLARY
Glucose-Capillary: 117 mg/dL — ABNORMAL HIGH (ref 70–99)
Glucose-Capillary: 131 mg/dL — ABNORMAL HIGH (ref 70–99)
Glucose-Capillary: 132 mg/dL — ABNORMAL HIGH (ref 70–99)
Glucose-Capillary: 133 mg/dL — ABNORMAL HIGH (ref 70–99)

## 2011-10-22 MED ORDER — LACTATED RINGERS IV SOLN
INTRAVENOUS | Status: DC
  Administered 2011-10-22 – 2011-10-23 (×2): via INTRAVENOUS

## 2011-10-22 NOTE — Progress Notes (Signed)
Patient ID: ENSLEIGH CAPILLA, female   DOB: 07-04-1985, 26 y.o.   MRN: [id number] Patient ID: LAMARIYA BIEDA, female   DOB: 1985-06-22, 26 y.o.   MRN: [id number] Hospital Day: 45  S: Preterm labor symptoms: no complaints  O: Blood pressure 96/65, pulse 92, temperature 98.1 F (36.7 C), temperature source Oral, resp. rate 18, height 5\' 4"  (1.626 m), weight 73.12 kg (161 lb 3.2 oz), last menstrual period 04/18/2011, SpO2 98.00%.   GEX:BMWUXLKG: 150 bpm, Variability: Good {> 6 bpm) and Decelerations: Absent Toco: None CBG (last 3)   Basename 10/22/11 0807 10/21/11 2206  GLUCAP 117* 168*     A/P- 26 y.o. admitted with:  Patient Active Hospital Problem List: Threatened preterm labor (10/02/2011)   Assessment: Stable   Plan: continue bedrest PROM (premature rupture of membranes) (10/21/2011)   Assessment: No over signs, symptoms of infection; leukocytosis secondary to steroids   Plan: per MFM;  ?GDM--2 hr GTT abnormal; ?spurious results, pt was not fasting Fasting CBG elevated s/p steroids; continue ADA diet   Pregnancy Complications: see above  Preterm labor management: MgSO4 until steroid course completed Dating:  [redacted]w[redacted]d   FWB:  Category I PTL:  See above ROD: C-section with labor; if remains breech

## 2011-10-22 NOTE — Consult Note (Signed)
Neonatology Consult Note:  At the request of the patients obstetrician Dr. Clearance Coots I met with Jennifer Burnett who is at 26 5 wks currently with preg complicated by PTL, PPROM (10/5) and breech presentation.  She was hospitalized last month for PTL and received  magnesium sulfate (9/14-16) and BMZ (9/14 -15), now with PPROM magnesium restarted 10/4 and BMZ given 10/4 and 5. We discussed morbidity/mortality at this gestional age (improved to 84% since her last consult on 9/23), delivery room resuscitation, including intubation and surfactant in DR. Discussed mechanical ventilation and risk for chronic lung disease, risk for IVH with potential for motor / cognitive deficits, ROP, NEC, sepsis, as well as temperature instability and feeding immaturity. Discussed NG / OG feeds, benefits of MBM in reducing incidence of NEC.  Discussed likely length of stay.  Thank you for allowing [place] to participate in her care. Please call with questions.  John Giovanni, DO  Neonatologist  Face to face time 25 min.

## 2011-10-23 LAB — GLUCOSE, CAPILLARY: Glucose-Capillary: 122 mg/dL — ABNORMAL HIGH (ref 70–99)

## 2011-10-23 NOTE — Progress Notes (Signed)
  Nutrition Dx: Food and nutrition-related knowledge deficit r/t no previous education aeb newly diagnosed GDM.    Nutrition education consult for Carbohydrate Modified Gestational Diabetic Diet completed.  "Meal  plan for gestational diabetics" handout given to patient.  Basic concepts reviewed:( Serum glucose goals, carbohydrate counting, foods that contain carbohydrate, consequences of high serum glucose levels ) Questions answered.  Patient verbalizes understanding.  Changed diet order to Gestational carbohydrate modified diet from CHO mod medium for better glucose management.  Elisabeth Cara M.Odis Luster LDN Neonatal Nutrition Support Specialist Pager [phone]

## 2011-10-23 NOTE — Progress Notes (Signed)
Diabetes Coordinator is following.  Ordered RD consult for GDM.  Will also enter order for GDM video 511 on patient education network.   Will follow up 10-24-11.    Thank you  Piedad Climes RN,BSN,CDE Inpatient Diabetes Coordinator [phone] (team pager)

## 2011-10-23 NOTE — Progress Notes (Signed)
Patient ID: PRECIOSA BUNDRICK, female   DOB: [birth year], 26 y.o.   MRN: [id number] Hospital Day: 52  S: Preterm labor symptoms: fluid leakage  O: Blood pressure 100/44, pulse 89, temperature 98.4 F (36.9 C), temperature source Oral, resp. rate 18, height 5\' 4"  (1.626 m), weight 73.12 kg (161 lb 3.2 oz), last menstrual period 04/18/2011, SpO2 98.00%.   VHQ:IONGEXBM: 150 bpm Toco: None WUX:LKGMWNUU: Fingertip Effacement (%): 50 Cervical Position: Middle Exam by:: dr Tamela Oddi  A/P- 26 y.o. admitted with:  Preterm cervical changes, then PPROM.  Stable.  Continue bedrest.    Pregnancy Complications: None  Preterm labor management: bedrest advised Dating:  [redacted]w[redacted]d PNL Needed:  none FWB:  good PTL:  stable ROD: C/S, if continues to be breech.

## 2011-10-24 LAB — GLUCOSE, CAPILLARY
Glucose-Capillary: 97 mg/dL (ref 70–99)
Glucose-Capillary: 102 mg/dL — ABNORMAL HIGH (ref 70–99)

## 2011-10-24 NOTE — Progress Notes (Signed)
Fasting CBG still above target at 97 mg/dl, but CBG's improving.  Diabetes Coordinator following.

## 2011-10-24 NOTE — Progress Notes (Signed)
UR Chart review completed.  

## 2011-10-24 NOTE — Progress Notes (Signed)
Patient ID: Jennifer Burnett, female   DOB: [birth year], 26 y.o.   MRN: [id number] Hospital Day: 38  S: Preterm labor symptoms: fluid leakage and preterm cervical changes.  O: Blood pressure 117/78, pulse 92, temperature 98.4 F (36.9 C), temperature source Oral, resp. rate 18, height 5\' 4"  (1.626 m), weight 73.12 kg (161 lb 3.2 oz), last menstrual period 04/18/2011, SpO2 98.00%.   WUJ:WJXBJYNW: 150 bpm Toco: None GNF:AOZHYQMV: Fingertip Effacement (%): 50 Cervical Position: Middle Exam by:: dr Tamela Oddi  A/P- 26 y.o. admitted with:  Preterm cervical changes.  Now PPROM.  Stable.  Continue bedrest.    Pregnancy Complications: None  Preterm labor management: bedrest advised Dating:  [redacted]w[redacted]d PNL Needed:  none FWB:  good PTL:  stable ROD: C/S if continues to be breech.

## 2011-10-25 ENCOUNTER — Encounter (HOSPITAL_COMMUNITY): Payer: Self-pay | Admitting: *Deleted

## 2011-10-25 ENCOUNTER — Inpatient Hospital Stay (HOSPITAL_COMMUNITY): Payer: Medicaid Other

## 2011-10-25 LAB — GLUCOSE, CAPILLARY
Glucose-Capillary: 130 mg/dL — ABNORMAL HIGH (ref 70–99)
Glucose-Capillary: 89 mg/dL (ref 70–99)
Glucose-Capillary: 116 mg/dL — ABNORMAL HIGH (ref 70–99)

## 2011-10-25 MED ORDER — SODIUM CHLORIDE 0.9 % IJ SOLN
3.0000 mL | Freq: Two times a day (BID) | INTRAMUSCULAR | Status: DC
  Administered 2011-10-25 – 2011-10-31 (×11): 3 mL via INTRAVENOUS

## 2011-10-25 NOTE — Progress Notes (Signed)
Patient ID: Jennifer Burnett, female   DOB: 08-Feb-1985, 26 y.o.   MRN: [id number] Hospital Day: 93  S: Preterm labor symptoms: no new complaints  O: Blood pressure 111/68, pulse 95, temperature 98.4 F (36.9 C), temperature source Oral, resp. rate 20, height 5\' 4"  (1.626 m), weight 161 lb 3.2 oz (73.12 kg), last menstrual period 04/18/2011, SpO2 98.00%.   ION:GEXBMWUX: 150 bpm, Variability: Good {> 6 bpm) and Decelerations: Absent Toco: None CBG (last 3)   Basename 10/25/11 0651 10/24/11 2035 10/24/11 1631  GLUCAP 130* 135* 102*     A/P- 26 y.o. admitted with:  Patient Active Hospital Problem List: Threatened preterm labor (10/02/2011)   Assessment: Stable   Plan: continue bedrest PROM (premature rupture of membranes) (10/21/2011)   Assessment: No over signs, symptoms of infection   Plan: per MFM  ?GDM--2 hr GTT abnormal; ?spurious results, pt was not fasting Fasting CBG elevated s/p steroids; continue ADA diet   Pregnancy Complications: see above  Preterm labor management: see above Dating:  [redacted]w[redacted]d   FWB:  Category I PTL:  See above ROD: C-section with labor; if remains breech

## 2011-10-25 NOTE — Progress Notes (Signed)
10/25/11 1100  Clinical Encounter Type  Visited With Patient  Visit Type Follow-up;Spiritual support;Social support  Referral From Nurse  Spiritual Encounters  Spiritual Needs Emotional    Jennifer Burnett was in great spirits when I visited today.  She works at maintaining a positive attitude and is trying to maximize what she can get out of this time (extra rest, time to work on her medical office administration homework, time to read and do word searches).  Per pt, she has good support from family and friends, including both phone calls and visits.  We talked about how her positive attitude and focus on self-calming will serve her well in other life situations, too, including parenting.  Jennifer Biskner is aware of ongoing chaplain availability and welcomes follow-up.  [address] [place], [place] [phone]

## 2011-10-26 LAB — GLUCOSE, CAPILLARY: Glucose-Capillary: 131 mg/dL — ABNORMAL HIGH (ref 70–99)

## 2011-10-26 NOTE — Progress Notes (Signed)
Patient ID: Jennifer Burnett, female   DOB: 1985/07/24, 26 y.o.   MRN: [id number] Hospital Day: 21  S: Preterm labor symptoms: Preterm cervical changes, now PPROM.  O: Blood pressure 119/77, pulse 98, temperature 98.4 F (36.9 C), temperature source Oral, resp. rate 20, height 5\' 4"  (1.626 m), weight 72.53 kg (159 lb 14.4 oz), last menstrual period 04/18/2011, SpO2 98.00%.   WUJ:WJXBJYNW: 150 bpm Toco: None GNF:AOZHYQMV: Fingertip Effacement (%): 50 Cervical Position: Middle Exam by:: dr Tamela Oddi  A/P- 26 y.o. admitted with:  Preterm cervical changes, now PPROM.    Pregnancy Complications: None  Preterm labor management: bedrest advised Dating:  [redacted]w[redacted]d PNL Needed:  none FWB:  good PTL:  stable ROD: C-section with labor.  Breech.

## 2011-10-26 NOTE — Consult Note (Addendum)
ADA Standards of Care 2012:  Dabetes in Pregnancy Target Ranges: Fasting - 60-90 mg/dL  Preprandial- 16-109 mg/dL Postprandial < 604 mg/dL (7.8 mmol/L  Diabetes In-pt team following patient.  Glucose appears well controlled on GDM diet.  Fasting glucose could be slightly improved, as target fasting is 60-90 mg/dL. Would suggest trying 10 units NPH at HS to get a little better control. (May need morning NPH in the next few weeks as her insulin resistance may increase.  Will follow and glad to recommend/assist as needed. Pt will not need to be taught how to administer insulin since she will not be discharged before delivery.  Would be safe to try the NPH at HS at this point where she can be observed.  Thank you, Lenor Coffin, RN, CNS, Diabetes Coordinator [phone]) (cell [phone]) Ad-Pt education booklets are stored on the antental unit.

## 2011-10-27 LAB — GLUCOSE, CAPILLARY
Glucose-Capillary: 103 mg/dL — ABNORMAL HIGH (ref 70–99)
Glucose-Capillary: 113 mg/dL — ABNORMAL HIGH (ref 70–99)
Glucose-Capillary: 99 mg/dL (ref 70–99)

## 2011-10-27 NOTE — Progress Notes (Signed)
UR Chart review completed.  

## 2011-10-27 NOTE — Progress Notes (Signed)
Monitors off. 

## 2011-10-27 NOTE — Progress Notes (Signed)
Patient ID: Jennifer Burnett, female   DOB: 10-27-1985, 26 y.o.   MRN: [id number] Hospital Day: 24  S: Preterm labor symptoms: fluid leakage and preterm cervical changes.  O: Blood pressure 114/70, pulse 105, temperature 98.4 F (36.9 C), temperature source Oral, resp. rate 18, height 5\' 4"  (1.626 m), weight 72.53 kg (159 lb 14.4 oz), last menstrual period 04/18/2011, SpO2 98.00%.   WUJ:WJXBJYNW: 150 bpm Toco: None GNF:AOZHYQMV: Fingertip Effacement (%): 50 Cervical Position: Middle Exam by:: dr Tamela Oddi  A/P- 26 y.o. admitted with : Preterm Cervical Changes, now PPROM.  Stable.  Continue bedrest.  Patient Active Hospital Problem List: Threatened preterm labor (10/02/2011)   Assessment: Stable.   Plan: Bedrest. PROM (premature rupture of membranes) (10/21/2011)   Assessment: Stable.   Plan: Bedrest.   Pregnancy Complications: Preterm cervical changes and PPROM. Preterm labor management: bedrest advised Dating:  [redacted]w[redacted]d PNL Needed:  none FWB:  good PTL:  stable ROD: C-section with labor if continues to be breech.

## 2011-10-28 LAB — GLUCOSE, CAPILLARY
Glucose-Capillary: 100 mg/dL — ABNORMAL HIGH (ref 70–99)
Glucose-Capillary: 113 mg/dL — ABNORMAL HIGH (ref 70–99)
Glucose-Capillary: 92 mg/dL (ref 70–99)
Glucose-Capillary: 80 mg/dL (ref 70–99)

## 2011-10-28 LAB — CBC
Hemoglobin: 13.5 g/dL (ref 12.0–15.0)
MCHC: 35.1 g/dL (ref 30.0–36.0)
RBC: 4.67 MIL/uL (ref 3.87–5.11)
WBC: 15.6 10*3/uL — ABNORMAL HIGH (ref 4.0–10.5)

## 2011-10-28 NOTE — Progress Notes (Signed)
Patient ID: Jennifer Burnett, female   DOB: [birth year], 26 y.o.   MRN: [id number] Vital signs normal Patient comfortable no change continue present therapy

## 2011-10-29 LAB — GLUCOSE, CAPILLARY
Glucose-Capillary: 126 mg/dL — ABNORMAL HIGH (ref 70–99)
Glucose-Capillary: 107 mg/dL — ABNORMAL HIGH (ref 70–99)
Glucose-Capillary: 112 mg/dL — ABNORMAL HIGH (ref 70–99)

## 2011-10-29 NOTE — Progress Notes (Signed)
Patient ID: Jennifer Burnett, female   DOB: 12-Jun-1985, 26 y.o.   MRN: [id number] Vital signs normal No complaints Still leaking No contractions

## 2011-10-30 ENCOUNTER — Inpatient Hospital Stay (HOSPITAL_COMMUNITY): Payer: Medicaid Other

## 2011-10-30 DIAGNOSIS — O24419 Gestational diabetes mellitus in pregnancy, unspecified control: Secondary | ICD-10-CM | POA: Clinically undetermined

## 2011-10-30 LAB — CBC WITH DIFFERENTIAL/PLATELET
Eosinophils Relative: 1 % (ref 0–5)
Hemoglobin: 11.9 g/dL — ABNORMAL LOW (ref 12.0–15.0)
Lymphs Abs: 1.5 10*3/uL (ref 0.7–4.0)
MCV: 83.3 fL (ref 78.0–100.0)
Monocytes Relative: 12 % (ref 3–12)
Platelets: 200 10*3/uL (ref 150–400)
RBC: 4.14 MIL/uL (ref 3.87–5.11)
WBC: 13.7 10*3/uL — ABNORMAL HIGH (ref 4.0–10.5)

## 2011-10-30 LAB — GLUCOSE, CAPILLARY

## 2011-10-30 MED ORDER — GLYBURIDE 1.25 MG PO TABS
1.2500 mg | ORAL_TABLET | Freq: Every day | ORAL | Status: DC
  Administered 2011-10-30: 1.25 mg via ORAL
  Filled 2011-10-30 (×2): qty 1

## 2011-10-30 NOTE — Progress Notes (Signed)
Ur chart review completed.  

## 2011-10-30 NOTE — Progress Notes (Signed)
Patient ID: ROGER FASNACHT, female   DOB: 1985-10-10, 26 y.o.   MRN: [id number] Hospital Day: 55  S: Preterm labor symptoms: fluid leakage  O: Blood pressure 129/79, pulse 110, temperature 98 F (36.7 C), temperature source Oral, resp. rate 20, height 5\' 4"  (1.626 m), weight 72.53 kg (159 lb 14.4 oz), last menstrual period 04/18/2011, SpO2 98.00%.   NWG:NFAOZHYQ: 150 bpm Toco: None MVH:QIONGEXB: Fingertip Effacement (%): 50 Cervical Position: Middle Exam by:: dr Tamela Oddi  CBG (last 3)   Basename 10/30/11 0637 10/29/11 2055 10/29/11 1706  GLUCAP 102* 120* 112*     A/P- 26 y.o. admitted with:  Patient Active Hospital Problem List: Threatened preterm labor (10/02/2011)   Assessment: Stable   Plan: continue current management PROM (premature rupture of membranes) (10/21/2011)   Assessment: see above   Plan: re check CBC w/diff Gestational diabetes mellitus, antepartum (10/30/2011)   Assessment: elevated fasting CBG   Plan: start Glyburide qhs   Pregnancy Complications: see above  Preterm labor management: see above Dating:  [redacted]w[redacted]d  FWB:  Antenatal testing reassuring

## 2011-10-31 ENCOUNTER — Encounter (HOSPITAL_COMMUNITY): Payer: Self-pay | Admitting: Anesthesiology

## 2011-10-31 ENCOUNTER — Encounter (HOSPITAL_COMMUNITY)
Admission: AD | Disposition: A | Discharge status: Home / Self Care | Payer: Self-pay | Source: Ambulatory Visit | Attending: Obstetrics

## 2011-10-31 ENCOUNTER — Encounter (HOSPITAL_COMMUNITY): Payer: Self-pay | Admitting: *Deleted

## 2011-10-31 ENCOUNTER — Inpatient Hospital Stay (HOSPITAL_COMMUNITY): Payer: Medicaid Other | Admitting: Anesthesiology

## 2011-10-31 LAB — GLUCOSE, CAPILLARY
Glucose-Capillary: 93 mg/dL (ref 70–99)
Glucose-Capillary: 61 mg/dL — ABNORMAL LOW (ref 70–99)
Glucose-Capillary: 86 mg/dL (ref 70–99)

## 2011-10-31 LAB — CBC
HCT: 36.6 % (ref 36.0–46.0)
Hemoglobin: 12.6 g/dL (ref 12.0–15.0)
MCH: 28.5 pg (ref 26.0–34.0)
MCV: 82.8 fL (ref 78.0–100.0)
RBC: 4.42 MIL/uL (ref 3.87–5.11)
WBC: 23 10*3/uL — ABNORMAL HIGH (ref 4.0–10.5)

## 2011-10-31 SURGERY — Surgical Case
Anesthesia: Spinal | Site: Abdomen | Wound class: Clean Contaminated

## 2011-10-31 MED ORDER — DIPHENHYDRAMINE HCL 50 MG/ML IJ SOLN: 12.5000 mg | INTRAMUSCULAR | Status: DC | PRN

## 2011-10-31 MED ORDER — DIPHENHYDRAMINE HCL 25 MG PO CAPS: 25.0000 mg | ORAL_CAPSULE | Freq: Four times a day (QID) | ORAL | Status: DC | PRN

## 2011-10-31 MED ORDER — KETOROLAC TROMETHAMINE 30 MG/ML IJ SOLN: 30.0000 mg | Freq: Four times a day (QID) | INTRAMUSCULAR | Status: AC | PRN

## 2011-10-31 MED ORDER — CEFAZOLIN SODIUM-DEXTROSE 2-3 GM-% IV SOLR
INTRAVENOUS | Status: DC | PRN
  Administered 2011-10-31: 2 g via INTRAVENOUS

## 2011-10-31 MED ORDER — OXYTOCIN 10 UNIT/ML IJ SOLN
40.0000 [IU] | INTRAVENOUS | Status: DC | PRN
  Administered 2011-10-31: 40 [IU] via INTRAVENOUS

## 2011-10-31 MED ORDER — FENTANYL CITRATE 0.05 MG/ML IJ SOLN
INTRAMUSCULAR | Status: DC | PRN
  Administered 2011-10-31: 25 ug via INTRATHECAL

## 2011-10-31 MED ORDER — ZOLPIDEM TARTRATE 5 MG PO TABS: 5.0000 mg | ORAL_TABLET | Freq: Every evening | ORAL | Status: DC | PRN

## 2011-10-31 MED ORDER — PHENYLEPHRINE HCL 10 MG/ML IJ SOLN
INTRAMUSCULAR | Status: DC | PRN
  Administered 2011-10-31 (×4): 40 ug via INTRAVENOUS

## 2011-10-31 MED ORDER — ONDANSETRON HCL 4 MG/2ML IJ SOLN: 4.0000 mg | Freq: Three times a day (TID) | INTRAMUSCULAR | Status: DC | PRN

## 2011-10-31 MED ORDER — 0.9 % SODIUM CHLORIDE (POUR BTL) OPTIME
TOPICAL | Status: DC | PRN
  Administered 2011-10-31: 1000 mL

## 2011-10-31 MED ORDER — SCOPOLAMINE 1 MG/3DAYS TD PT72
1.0000 | MEDICATED_PATCH | Freq: Once | TRANSDERMAL | Status: DC
  Filled 2011-10-31: qty 1

## 2011-10-31 MED ORDER — FENTANYL CITRATE 0.05 MG/ML IJ SOLN
INTRAMUSCULAR | Status: AC
  Filled 2011-10-31: qty 2

## 2011-10-31 MED ORDER — LACTATED RINGERS IV SOLN
INTRAVENOUS | Status: DC
  Administered 2011-10-31: 21:00:00 via INTRAVENOUS

## 2011-10-31 MED ORDER — LACTATED RINGERS IV BOLUS (SEPSIS)
500.0000 mL | Freq: Once | INTRAVENOUS | Status: AC
  Administered 2011-10-31: 500 mL via INTRAVENOUS

## 2011-10-31 MED ORDER — NALOXONE HCL 0.4 MG/ML IJ SOLN: 0.4000 mg | INTRAMUSCULAR | Status: DC | PRN

## 2011-10-31 MED ORDER — PHENYLEPHRINE 40 MCG/ML (10ML) SYRINGE FOR IV PUSH (FOR BLOOD PRESSURE SUPPORT)
PREFILLED_SYRINGE | INTRAVENOUS | Status: AC
  Filled 2011-10-31: qty 10

## 2011-10-31 MED ORDER — MORPHINE SULFATE (PF) 0.5 MG/ML IJ SOLN
INTRAMUSCULAR | Status: DC | PRN
  Administered 2011-10-31: .15 mg via INTRATHECAL

## 2011-10-31 MED ORDER — FENTANYL CITRATE 0.05 MG/ML IJ SOLN: 25.0000 ug | INTRAMUSCULAR | Status: DC | PRN

## 2011-10-31 MED ORDER — METOCLOPRAMIDE HCL 5 MG/ML IJ SOLN: 10.0000 mg | Freq: Three times a day (TID) | INTRAMUSCULAR | Status: DC | PRN

## 2011-10-31 MED ORDER — CEFAZOLIN SODIUM-DEXTROSE 2-3 GM-% IV SOLR
INTRAVENOUS | Status: AC
  Filled 2011-10-31: qty 50

## 2011-10-31 MED ORDER — NALBUPHINE HCL 10 MG/ML IJ SOLN
5.0000 mg | INTRAMUSCULAR | Status: DC | PRN
  Filled 2011-10-31: qty 1

## 2011-10-31 MED ORDER — OXYTOCIN 40 UNITS IN LACTATED RINGERS INFUSION - SIMPLE MED: 62.5000 mL/h | INTRAVENOUS | Status: AC

## 2011-10-31 MED ORDER — WITCH HAZEL-GLYCERIN EX PADS: 1.0000 "application " | MEDICATED_PAD | CUTANEOUS | Status: DC | PRN

## 2011-10-31 MED ORDER — ONDANSETRON HCL 4 MG/2ML IJ SOLN: 4.0000 mg | INTRAMUSCULAR | Status: DC | PRN

## 2011-10-31 MED ORDER — SIMETHICONE 80 MG PO CHEW: 80.0000 mg | CHEWABLE_TABLET | ORAL | Status: DC | PRN

## 2011-10-31 MED ORDER — DIPHENHYDRAMINE HCL 50 MG/ML IJ SOLN: 25.0000 mg | INTRAMUSCULAR | Status: DC | PRN

## 2011-10-31 MED ORDER — PRENATAL MULTIVITAMIN CH
1.0000 | ORAL_TABLET | Freq: Every day | ORAL | Status: DC
  Administered 2011-11-01 – 2011-11-03 (×3): 1 via ORAL
  Filled 2011-10-31 (×4): qty 1

## 2011-10-31 MED ORDER — OXYTOCIN 10 UNIT/ML IJ SOLN
INTRAMUSCULAR | Status: AC
  Filled 2011-10-31: qty 4

## 2011-10-31 MED ORDER — SODIUM CHLORIDE 0.9 % IJ SOLN
3.0000 mL | INTRAMUSCULAR | Status: DC | PRN
  Administered 2011-11-01: 3 mL via INTRAVENOUS

## 2011-10-31 MED ORDER — DIBUCAINE 1 % RE OINT: 1.0000 "application " | TOPICAL_OINTMENT | RECTAL | Status: DC | PRN

## 2011-10-31 MED ORDER — MEPERIDINE HCL 25 MG/ML IJ SOLN: 6.2500 mg | INTRAMUSCULAR | Status: DC | PRN

## 2011-10-31 MED ORDER — DEXTROSE 5 % IV SOLN
2.0000 g | Freq: Four times a day (QID) | INTRAVENOUS | Status: AC
  Administered 2011-10-31 – 2011-11-01 (×4): 2 g via INTRAVENOUS
  Filled 2011-10-31 (×4): qty 2

## 2011-10-31 MED ORDER — SIMETHICONE 80 MG PO CHEW
80.0000 mg | CHEWABLE_TABLET | Freq: Three times a day (TID) | ORAL | Status: DC
  Administered 2011-10-31 – 2011-11-03 (×11): 80 mg via ORAL

## 2011-10-31 MED ORDER — BUPIVACAINE IN DEXTROSE 0.75-8.25 % IT SOLN
INTRATHECAL | Status: DC | PRN
  Administered 2011-10-31: .5 mL via INTRATHECAL

## 2011-10-31 MED ORDER — TETANUS-DIPHTH-ACELL PERTUSSIS 5-2.5-18.5 LF-MCG/0.5 IM SUSP: 0.5000 mL | Freq: Once | INTRAMUSCULAR | Status: DC

## 2011-10-31 MED ORDER — MEDROXYPROGESTERONE ACETATE 150 MG/ML IM SUSP: 150.0000 mg | INTRAMUSCULAR | Status: DC | PRN

## 2011-10-31 MED ORDER — SODIUM CHLORIDE 0.9 % IV SOLN
1.0000 ug/kg/h | INTRAVENOUS | Status: DC | PRN
  Filled 2011-10-31: qty 2.5

## 2011-10-31 MED ORDER — LACTATED RINGERS IV SOLN
INTRAVENOUS | Status: DC | PRN
  Administered 2011-10-31 (×4): via INTRAVENOUS

## 2011-10-31 MED ORDER — ONDANSETRON HCL 4 MG PO TABS: 4.0000 mg | ORAL_TABLET | ORAL | Status: DC | PRN

## 2011-10-31 MED ORDER — SENNOSIDES-DOCUSATE SODIUM 8.6-50 MG PO TABS
2.0000 | ORAL_TABLET | Freq: Every day | ORAL | Status: DC
  Administered 2011-10-31 – 2011-11-02 (×3): 2 via ORAL

## 2011-10-31 MED ORDER — IBUPROFEN 600 MG PO TABS
600.0000 mg | ORAL_TABLET | Freq: Four times a day (QID) | ORAL | Status: DC
  Administered 2011-10-31 – 2011-11-03 (×10): 600 mg via ORAL
  Filled 2011-10-31 (×10): qty 1

## 2011-10-31 MED ORDER — MORPHINE SULFATE 0.5 MG/ML IJ SOLN
INTRAMUSCULAR | Status: AC
  Filled 2011-10-31: qty 10

## 2011-10-31 MED ORDER — ONDANSETRON HCL 4 MG/2ML IJ SOLN
INTRAMUSCULAR | Status: DC | PRN
  Administered 2011-10-31: 4 mg via INTRAVENOUS

## 2011-10-31 MED ORDER — CITRIC ACID-SODIUM CITRATE 334-500 MG/5ML PO SOLN
ORAL | Status: AC
  Administered 2011-10-31: 30 mL
  Filled 2011-10-31: qty 15

## 2011-10-31 MED ORDER — MENTHOL 3 MG MT LOZG: 1.0000 | LOZENGE | OROMUCOSAL | Status: DC | PRN

## 2011-10-31 MED ORDER — IBUPROFEN 600 MG PO TABS
600.0000 mg | ORAL_TABLET | Freq: Four times a day (QID) | ORAL | Status: DC | PRN
  Filled 2011-10-31: qty 1

## 2011-10-31 MED ORDER — OXYCODONE-ACETAMINOPHEN 5-325 MG PO TABS
1.0000 | ORAL_TABLET | ORAL | Status: DC | PRN
  Administered 2011-11-01 – 2011-11-02 (×5): 2 via ORAL
  Administered 2011-11-03: 1 via ORAL
  Filled 2011-10-31 (×2): qty 2
  Filled 2011-10-31: qty 1
  Filled 2011-10-31 (×3): qty 2

## 2011-10-31 MED ORDER — LANOLIN HYDROUS EX OINT: 1.0000 "application " | TOPICAL_OINTMENT | CUTANEOUS | Status: DC | PRN

## 2011-10-31 MED ORDER — DIPHENHYDRAMINE HCL 25 MG PO CAPS: 25.0000 mg | ORAL_CAPSULE | ORAL | Status: DC | PRN

## 2011-10-31 MED ORDER — ONDANSETRON HCL 4 MG/2ML IJ SOLN
INTRAMUSCULAR | Status: AC
  Filled 2011-10-31: qty 2

## 2011-10-31 SURGICAL SUPPLY — 45 items
ADH SKN CLS APL DERMABOND .7 (GAUZE/BANDAGES/DRESSINGS) ×1
CANISTER WOUND CARE 500ML ATS (WOUND CARE) IMPLANT
CLOTH BEACON ORANGE TIMEOUT ST (SAFETY) ×2 IMPLANT
CONTAINER PREFILL 10% NBF 15ML (MISCELLANEOUS) ×2 IMPLANT
DERMABOND ADVANCED (GAUZE/BANDAGES/DRESSINGS) ×1
DERMABOND ADVANCED .7 DNX12 (GAUZE/BANDAGES/DRESSINGS) ×1 IMPLANT
DRAPE SURG 17X23 STRL (DRAPES) ×2 IMPLANT
DRSG COVADERM 4X10 (GAUZE/BANDAGES/DRESSINGS) ×2 IMPLANT
DRSG VAC ATS LRG SENSATRAC (GAUZE/BANDAGES/DRESSINGS) IMPLANT
DRSG VAC ATS MED SENSATRAC (GAUZE/BANDAGES/DRESSINGS) IMPLANT
DRSG VAC ATS SM SENSATRAC (GAUZE/BANDAGES/DRESSINGS) IMPLANT
DURAPREP 26ML APPLICATOR (WOUND CARE) ×2 IMPLANT
ELECT REM PT RETURN 9FT ADLT (ELECTROSURGICAL) ×2
ELECTRODE REM PT RTRN 9FT ADLT (ELECTROSURGICAL) ×1 IMPLANT
EXTRACTOR VACUUM M CUP 4 TUBE (SUCTIONS) IMPLANT
GLOVE BIO SURGEON STRL SZ8 (GLOVE) ×4 IMPLANT
GOWN PREVENTION PLUS LG XLONG (DISPOSABLE) ×4 IMPLANT
GOWN PREVENTION PLUS XLARGE (GOWN DISPOSABLE) ×2 IMPLANT
KIT ABG SYR 3ML LUER SLIP (SYRINGE) IMPLANT
NDL HYPO 25X5/8 SAFETYGLIDE (NEEDLE) ×1 IMPLANT
NEEDLE HYPO 25X5/8 SAFETYGLIDE (NEEDLE) IMPLANT
NS IRRIG 1000ML POUR BTL (IV SOLUTION) ×2 IMPLANT
PACK C SECTION WH (CUSTOM PROCEDURE TRAY) ×2 IMPLANT
PAD OB MATERNITY 4.3X12.25 (PERSONAL CARE ITEMS) ×1 IMPLANT
RETRACTOR WND ALEXIS 25 LRG (MISCELLANEOUS) IMPLANT
RTRCTR C-SECT PINK 25CM LRG (MISCELLANEOUS) ×1 IMPLANT
RTRCTR WOUND ALEXIS 25CM LRG (MISCELLANEOUS) ×2
SLEEVE SCD COMPRESS KNEE MED (MISCELLANEOUS) ×1 IMPLANT
STAPLER VISISTAT 35W (STAPLE) ×1 IMPLANT
SUT GUT PLAIN 0 CT-3 TAN 27 (SUTURE) IMPLANT
SUT MNCRL 0 VIOLET CTX 36 (SUTURE) ×3 IMPLANT
SUT MNCRL AB 4-0 PS2 18 (SUTURE) IMPLANT
SUT MON AB 2-0 CT1 27 (SUTURE) ×2 IMPLANT
SUT MON AB 3-0 SH 27 (SUTURE)
SUT MON AB 3-0 SH27 (SUTURE) IMPLANT
SUT MONOCRYL 0 CTX 36 (SUTURE) ×3
SUT PDS AB 0 CTX 60 (SUTURE) IMPLANT
SUT PLAIN 2 0 XLH (SUTURE) IMPLANT
SUT VIC AB 0 CTX 36 (SUTURE)
SUT VIC AB 0 CTX36XBRD ANBCTRL (SUTURE) IMPLANT
SUT VIC AB 2-0 CT1 27 (SUTURE)
SUT VIC AB 2-0 CT1 TAPERPNT 27 (SUTURE) IMPLANT
TOWEL OR 17X24 6PK STRL BLUE (TOWEL DISPOSABLE) ×4 IMPLANT
TRAY FOLEY CATH 14FR (SET/KITS/TRAYS/PACK) ×2 IMPLANT
WATER STERILE IRR 1000ML POUR (IV SOLUTION) ×1 IMPLANT

## 2011-10-31 NOTE — Anesthesia Preprocedure Evaluation (Signed)
Anesthesia Evaluation  Patient identified by MRN, date of birth, ID band Patient awake    Reviewed: Allergy & Precautions, H&P , Patient's Chart, lab work & pertinent test results  Airway Mallampati: II TM Distance: >3 FB Neck ROM: Full    Dental No notable dental hx. (+) Teeth Intact   Pulmonary neg pulmonary ROS,  breath sounds clear to auscultation  Pulmonary exam normal       Cardiovascular negative cardio ROS  Rhythm:Regular Rate:Normal     Neuro/Psych negative neurological ROS  negative psych ROS   GI/Hepatic negative GI ROS, Neg liver ROS,   Endo/Other  negative endocrine ROSdiabetes, Well Controlled, Gestational  Renal/GU negative Renal ROS  negative genitourinary   Musculoskeletal negative musculoskeletal ROS (+)   Abdominal Normal abdominal exam  (+)   Peds  Hematology negative hematology ROS (+) Sickle Cell Trait   Anesthesia Other Findings   Reproductive/Obstetrics (+) Pregnancy Breech in labor 28 weeks                           Anesthesia Physical Anesthesia Plan  ASA: II and Emergent  Anesthesia Plan: Spinal   Post-op Pain Management:    Induction:   Airway Management Planned:   Additional Equipment:   Intra-op Plan:   Post-operative Plan:   Informed Consent: I have reviewed the patients History and Physical, chart, labs and discussed the procedure including the risks, benefits and alternatives for the proposed anesthesia with the patient or authorized representative who has indicated his/her understanding and acceptance.     Plan Discussed with: Anesthesiologist  Anesthesia Plan Comments:         Anesthesia Quick Evaluation

## 2011-10-31 NOTE — Progress Notes (Signed)
Patient ID: Jennifer Burnett, female   DOB: 09/04/1985, 26 y.o.   MRN: [id number] Hospital Day: 59  S: Preterm labor symptoms: fluid leakage  O: Blood pressure 117/71, pulse 95, temperature 97.6 F (36.4 C), temperature source Oral, resp. rate 18, height 5\' 4"  (1.626 m), weight 72.53 kg (159 lb 14.4 oz), last menstrual period 04/18/2011, SpO2 98.00%.   JXB:JYNWGNFA: 150 bpm Toco: None OZH:YQMVHQIO: Fingertip Effacement (%): 50 Cervical Position: Middle Exam by:: dr Tamela Oddi  A/P- 26 y.o. admitted with:  Preterm cervical changes.  Now PPROM.  Stable.  Bedrest.    Pregnancy Complications: None  Preterm labor management: bedrest advised Dating:  [redacted]w[redacted]d PNL Needed:  nonw FWB:  good PTL:  stable

## 2011-10-31 NOTE — Transfer of Care (Signed)
Immediate Anesthesia Transfer of Care Note  Patient: Jennifer Burnett  Procedure(s) Performed: Procedure(s) (LRB) with comments: CESAREAN SECTION (N/A)  Patient Location: PACU  Anesthesia Type: Spinal  Level of Consciousness: awake, alert  and oriented  Airway & Oxygen Therapy: Patient Spontanous Breathing  Post-op Assessment: Report given to PACU RN and Post -op Vital signs reviewed and stable  Post vital signs: stable  Complications: No apparent anesthesia complications

## 2011-10-31 NOTE — Op Note (Signed)
Cesarean Section Procedure Note   Jennifer Burnett   09/30/2011 - 10/31/2011  Indications: Breech Presentation and PPROM.  Labor.   Pre-operative Diagnosis: 28 weeks; in labor; breech.   Post-operative Diagnosis: Same   Surgeon: HARPER,CHARLES A  Assistants: Surgical Technician  Anesthesia: spinal  Procedure Details:  The patient was seen in the Holding Room. The risks, benefits, complications, treatment options, and expected outcomes were discussed with the patient. The patient concurred with the proposed plan, giving informed consent. The patient was identified as Jennifer Burnett and the procedure verified as C-Section Delivery. A Time Out was held and the above information confirmed.  After induction of anesthesia, the patient was draped and prepped in the usual sterile manner. A transverse incision was made and carried down through the subcutaneous tissue to the fascia. The fascial incision was made and extended transversely. The fascia was separated from the underlying rectus tissue superiorly and inferiorly. The peritoneum was identified and entered. The peritoneal incision was extended longitudinally. The utero-vesical peritoneal reflection was incised transversely and the bladder flap was bluntly freed from the lower uterine segment. A low transverse uterine incision was made. Delivered from cephalic presentation was a 1230 gram living newborn female infant(s). APGAR (1 MIN): 6   APGAR (5 MINS): 7   APGAR (10 MINS):    A cord ph was not sent. The umbilical cord was clamped and cut cord. A sample was obtained for evaluation. The placenta was removed Intact and appeared normal.  The uterine incision was closed with running locked sutures of 1-0 Monocryl. A second imbricating layer of the same suture was placed.  Hemostasis was observed. The paracolic gutters were irrigated. The parieto peritoneum was closed in a running fashion with 2-0 Vicryl.  The fascia was then reapproximated with running  sutures of 0 vicryl.  The skin was closed with staples.  Instrument, sponge, and needle counts were correct prior the abdominal closure and were correct at the conclusion of the case.    Findings:   Estimated Blood Loss:  Total IV Fluids:  Urine Output: 400CC OF clear urine  Specimens: @ORSPECIMEN @   Complications: no complications  Disposition: PACU - hemodynamically stable.  Maternal Condition: stable   Baby condition / location:  NICU for prematurity.    Signed: Surgeon(s): Brock Bad, MD

## 2011-10-31 NOTE — Progress Notes (Signed)
Patient ID: KIRIN BRANDENBURGER, female   DOB: 01/06/1986, 26 y.o.   MRN: [id number] Hospital Day: 22  S: Preterm labor symptoms: UC's and pressure. O: Blood pressure 127/80, pulse 115, temperature 98.1 F (36.7 C), temperature source Oral, resp. rate 18, height 5\' 4"  (1.626 m), weight 72.53 kg (159 lb 14.4 oz), last menstrual period 04/18/2011, SpO2 98.00%.   WUJ:WJXBJYNW: 140-150 bpm, variables. Toco: UC's q 2-4 minutes GNF:AOZHYQMV: 1 Effacement (%): 90 Cervical Position: Anterior Presentation: Single Footling Breech Exam by:: s cole, and r. simpson,rn  A/P- 26 y.o. admitted with:  Preterm cervical changes, then PPROM.  Now with painful UC's.  Will proceed with C/S.    Pregnancy Complications: oligohydramnios  Preterm labor management: bedrest advised Dating:  [redacted]w[redacted]d PNL Needed:  none FWB:  good PTL:  Having strong UC's. ROD: C/S for Breech Presentations

## 2011-10-31 NOTE — Anesthesia Postprocedure Evaluation (Signed)
  Anesthesia Post-op Note  Patient: Jennifer Burnett  Procedure(s) Performed: Procedure(s) (LRB) with comments: CESAREAN SECTION (N/A)  Patient Location: PACU  Anesthesia Type: Spinal  Level of Consciousness: awake, alert  and oriented  Airway and Oxygen Therapy: Patient Spontanous Breathing  Post-op Pain: none  Post-op Assessment: Post-op Vital signs reviewed, Patient's Cardiovascular Status Stable, Respiratory Function Stable, Patent Airway, No signs of Nausea or vomiting, Pain level controlled, No headache, No backache and No residual numbness  Post-op Vital Signs: Reviewed and stable  Complications: No apparent anesthesia complications

## 2011-10-31 NOTE — Progress Notes (Signed)
rn went over to talk to dr Clearance Coots on L&D - c/s called - rn notified OR - camille, Anesthesia, house coverage and dr Mikle Bosworth in NICU. Pt prep began

## 2011-10-31 NOTE — Anesthesia Procedure Notes (Signed)
Spinal  Patient location during procedure: OR Start time: 10/31/2011 3:00 PM Staffing Anesthesiologist: Read Bonelli A. Performed by: anesthesiologist  Preanesthetic Checklist Completed: patient identified, site marked, surgical consent, pre-op evaluation, timeout performed, IV checked, risks and benefits discussed and monitors and equipment checked Spinal Block Patient position: sitting Prep: site prepped and draped and DuraPrep Patient monitoring: heart rate, cardiac monitor, continuous pulse ox and blood pressure Approach: midline Location: L3-4 Injection technique: single-shot Needle Needle type: Sprotte  Needle gauge: 24 G Needle length: 9 cm Needle insertion depth: 4 cm Assessment Sensory level: T4 Additional Notes Patient tolerated procedure well. Adequate sensory level.

## 2011-11-01 ENCOUNTER — Encounter (HOSPITAL_COMMUNITY): Payer: Self-pay | Admitting: Obstetrics

## 2011-11-01 LAB — CBC
HCT: 30.3 % — ABNORMAL LOW (ref 36.0–46.0)
MCH: 28.7 pg (ref 26.0–34.0)
MCHC: 34.3 g/dL (ref 30.0–36.0)
RDW: 13.9 % (ref 11.5–15.5)

## 2011-11-01 NOTE — Progress Notes (Signed)
Subjective: Postpartum Day 1: Cesarean Delivery Patient reports incisional pain and tolerating PO.    Objective: Vital signs in last 24 hours: Temp:  [97.7 F (36.5 C)-99.2 F (37.3 C)] 97.7 F (36.5 C) (10/16 0530) Pulse Rate:  [67-134] 67  (10/16 0530) Resp:  [16-19] 18  (10/16 0530) BP: (102-143)/(54-85) 108/54 mmHg (10/16 0530) SpO2:  [99 %-100 %] 99 % (10/16 0530)  Physical Exam:  General: alert and no distress Lochia: appropriate Uterine Fundus: firm Incision: healing well DVT Evaluation: No evidence of DVT seen on physical exam.   Basename 10/31/11 1426 10/30/11 0920  HGB 12.6 11.9*  HCT 36.6 34.5*    Assessment/Plan: Status post Cesarean section. Doing well postoperatively.  Continue current care.  Acquanetta Cabanilla A 11/01/2011, 6:12 AM

## 2011-11-01 NOTE — Anesthesia Postprocedure Evaluation (Signed)
  Anesthesia Post-op Note  Patient: Jennifer Burnett  Procedure(s) Performed: Procedure(s) (LRB) with comments: CESAREAN SECTION (N/A)  Patient Location: PACU and Women's Unit  Anesthesia Type: Spinal  Level of Consciousness: awake, alert , oriented and patient cooperative  Airway and Oxygen Therapy: Patient Spontanous Breathing  Post-op Pain: none  Post-op Assessment: Post-op Vital signs reviewed and Patient's Cardiovascular Status Stable  Post-op Vital Signs: Reviewed and stable  Complications: No apparent anesthesia complications

## 2011-11-01 NOTE — Addendum Note (Signed)
Addendum  created 11/01/11 1629 by Orlie Pollen, CRNA   Modules edited:Notes Section

## 2011-11-02 ENCOUNTER — Inpatient Hospital Stay (HOSPITAL_COMMUNITY): Payer: Medicaid Other

## 2011-11-02 NOTE — Progress Notes (Signed)
UR Chart review completed.  

## 2011-11-03 MED ORDER — IBUPROFEN 600 MG PO TABS: 600.0000 mg | ORAL_TABLET | Freq: Four times a day (QID) | ORAL | Status: DC

## 2011-11-03 MED ORDER — FUSION PLUS PO CAPS: 1.0000 | ORAL_CAPSULE | Freq: Every day | ORAL | Status: DC

## 2011-11-03 MED ORDER — OXYCODONE-ACETAMINOPHEN 5-325 MG PO TABS: 1.0000 | ORAL_TABLET | ORAL | Status: DC | PRN

## 2011-11-03 NOTE — Progress Notes (Signed)
Subjective: Postpartum Day 3: Cesarean Delivery Patient reports incisional pain, tolerating PO, + flatus, + BM and no problems voiding.    Objective: Vital signs in last 24 hours: Temp:  [97.4 F (36.3 C)-98.1 F (36.7 C)] 98.1 F (36.7 C) (10/17 2200) Pulse Rate:  [93-105] 93  (10/17 2200) Resp:  [18-20] 19  (10/17 2200) BP: (108-130)/(66-85) 108/66 mmHg (10/17 2200) SpO2:  [100 %] 100 % (10/17 2200)  Physical Exam:  General: alert and no distress Lochia: appropriate Uterine Fundus: firm Incision: healing well DVT Evaluation: No evidence of DVT seen on physical exam.   Basename 11/01/11 0530 10/31/11 1426  HGB 10.4* 12.6  HCT 30.3* 36.6    Assessment/Plan: Status post Cesarean section. Doing well postoperatively.   Discharge home with standard precautions and return to clinic in 2 weeks.  Zya Finkle A 11/03/2011, 4:58 AM

## 2011-11-03 NOTE — Plan of Care (Signed)
Problem: Discharge Progression Outcomes Goal: Other Discharge Outcomes/Goals Outcome: Completed/Met Date Met:  11/03/11 Valve from [employer] pump was broken. Went to [employer] and they gave me replacement part for it prior to discharge..  Pt was very grateful.  MTDailey RN

## 2011-11-03 NOTE — Discharge Summary (Signed)
Obstetric Discharge Summary Reason for Admission: Preterm cervical changes. Prenatal Procedures: ultrasound Intrapartum Procedures: cesarean: low cervical, transverse Postpartum Procedures: none Complications-Operative and Postpartum: none Hemoglobin  Date Value Range Status  11/01/2011 10.4* 12.0 - 15.0 g/dL Final     DELTA CHECK NOTED     REPEATED TO VERIFY     HCT  Date Value Range Status  11/01/2011 30.3* 36.0 - 46.0 % Final    Physical Exam:  General: alert and no distress Lochia: appropriate Uterine Fundus: firm Incision: healing well DVT Evaluation: No evidence of DVT seen on physical exam.  Discharge Diagnoses: S/P cesarean section for PPROM, breech in labor.  Discharge Information: Date: 11/03/2011 Activity: pelvic rest Diet: routine Medications: PNV, Ibuprofen, Iron and Percocet Condition: stable Instructions: refer to practice specific booklet Discharge to: home Follow-up Information    Follow up with Ishani Goldwasser A, MD. Schedule an appointment as soon as possible for a visit in 6 weeks.   Contact information:   [address] [place] [address] [phone]          Newborn Data: Live born female  Birth Weight: 2 lb 11.4 oz (1230 g) APGAR: 6, 7  Home with mother.  Satomi Buda A 11/03/2011, 5:05 AM

## 2011-11-03 NOTE — Progress Notes (Signed)
Patient refused Flu vaccine and Tdap.

## 2012-08-15 ENCOUNTER — Encounter (HOSPITAL_COMMUNITY): Payer: Self-pay | Admitting: Emergency Medicine

## 2012-08-15 ENCOUNTER — Emergency Department (INDEPENDENT_AMBULATORY_CARE_PROVIDER_SITE_OTHER)
Admission: EM | Admit: 2012-08-15 | Discharge: 2012-08-15 | Disposition: A | Discharge status: Home / Self Care | Payer: Medicaid Other | Source: Home / Self Care

## 2012-08-15 DIAGNOSIS — M545 Low back pain: Secondary | ICD-10-CM

## 2012-08-15 LAB — POCT URINALYSIS DIP (DEVICE)
Bilirubin Urine: NEGATIVE
Glucose, UA: NEGATIVE mg/dL
Hgb urine dipstick: NEGATIVE
Specific Gravity, Urine: 1.025 (ref 1.005–1.030)
pH: 6 (ref 5.0–8.0)

## 2012-08-15 MED ORDER — TRAMADOL HCL 50 MG PO TABS: 50.0000 mg | ORAL_TABLET | Freq: Four times a day (QID) | ORAL | Status: DC | PRN

## 2012-08-15 MED ORDER — KETOROLAC TROMETHAMINE 60 MG/2ML IM SOLN
INTRAMUSCULAR | Status: AC
  Filled 2012-08-15: qty 2

## 2012-08-15 MED ORDER — KETOROLAC TROMETHAMINE 60 MG/2ML IM SOLN
60.0000 mg | Freq: Once | INTRAMUSCULAR | Status: AC
  Administered 2012-08-15: 60 mg via INTRAMUSCULAR

## 2012-08-15 MED ORDER — METHYLPREDNISOLONE 4 MG PO KIT: PACK | ORAL | Status: DC

## 2012-08-15 NOTE — ED Provider Notes (Signed)
Medical screening examination/treatment/procedure(s) were performed by resident physician or non-physician practitioner and as supervising physician I was immediately available for consultation/collaboration.   KINDL,JAMES DOUGLAS MD.   James D Kindl, MD 08/15/12 2018 

## 2012-08-15 NOTE — ED Notes (Signed)
Pt c/o lower back pain... Last 3 weeks have been worse but first noticed the back pain when she had her first child back in 10/13 (c-section)... Pain increases when she is lying, sitting and w/any movement.... Denies: inj/trauma, strenuous activity, urinary sxs... Alert w/no signs of acute distress.

## 2012-08-15 NOTE — ED Provider Notes (Signed)
CSN: [id number]     Arrival date & time 08/15/12  1449 History     First MD Initiated Contact with Patient 08/15/12 1519     Chief Complaint  Patient presents with  . Back Pain   (Consider location/radiation/quality/duration/timing/severity/associated sxs/prior Treatment) HPI Comments: This 27 year old female delivered a viable female in October of 2013. Shortly afterwards she developed a rare pain along the mid lumbar spine. In the past 7-8 months and she has been lifting the baby she has developed increasing pain in the lumbar spine. The last 2-3 weeks the pain is been moderate to severe. She describes it as sharp shooting and more constant than not. It is worse with bending forward, lifting, prolonged sitting and lying on her abdomen. She denies radiation of pain there is no radicular pain she denies focal weakness or paresthesias.   Past Medical History  Diagnosis Date  . Sickle cell trait   . PID (acute pelvic inflammatory disease)     as teenager   Past Surgical History  Procedure Laterality Date  . No past surgeries    . Cesarean section  10/31/2011    Procedure: CESAREAN SECTION;  Surgeon: Brock Bad, MD;  Location: [facility];  Service: Obstetrics;  Laterality: N/A;   Family History  Problem Relation Age of Onset  . Anesthesia problems Neg Hx   . Hypotension Neg Hx   . Malignant hyperthermia Neg Hx   . Pseudochol deficiency Neg Hx   . Other Neg Hx    History  Substance Use Topics  . Smoking status: Never Smoker   . Smokeless tobacco: Never Used  . Alcohol Use: No   OB History   Grav Para Term Preterm Abortions TAB SAB Ect Mult Living   1 1 0 1 0 0 0 0 0 1      Review of Systems  Constitutional: Positive for activity change. Negative for fever, chills and fatigue.  HENT: Negative.   Respiratory: Negative.   Cardiovascular: Negative.   Genitourinary: Negative.   Musculoskeletal:       As per HPI  Skin: Negative for color change, pallor and rash.   Neurological: Negative.  Negative for dizziness, tremors, syncope, weakness, light-headedness and numbness.    Allergies  Review of patient's allergies indicates no known allergies.  Home Medications   Current Outpatient Rx  Name  Route  Sig  Dispense  Refill  . cholecalciferol (VITAMIN D) 400 UNITS TABS   Oral   Take 400 Units by mouth every morning.         Marland Kitchen ibuprofen (ADVIL,MOTRIN) 600 MG tablet   Oral   Take 1 tablet (600 mg total) by mouth every 6 (six) hours.   30 tablet   5   . Iron-FA-B Cmp-C-Biot-Probiotic (FUSION PLUS) CAPS   Oral   Take 1 capsule by mouth daily before breakfast.   30 capsule   5   . methylPREDNISolone (MEDROL DOSEPAK) 4 MG tablet      follow package directions. Take with food   21 tablet   0   . oxyCODONE-acetaminophen (PERCOCET/ROXICET) 5-325 MG per tablet   Oral   Take 1-2 tablets by mouth every 4 (four) hours as needed (moderate - severe pain).   40 tablet   0   . Prenatal Vit-Fe Fumarate-FA (MULTIVITAMIN-PRENATAL) 27-0.8 MG TABS   Oral   Take 1 tablet by mouth daily with breakfast.         . traMADol (ULTRAM) 50 MG tablet   Oral  Take 1 tablet (50 mg total) by mouth every 6 (six) hours as needed for pain.   20 tablet   0    BP 124/83  Pulse 80  Temp(Src) 98.3 F (36.8 C) (Oral)  Resp 16  SpO2 100%  LMP 08/03/2012  Breastfeeding? No Physical Exam  Nursing note and vitals reviewed. Constitutional: She is oriented to person, place, and time. She appears well-developed and well-nourished. No distress.  HENT:  Head: Normocephalic and atraumatic.  Eyes: EOM are normal. Pupils are equal, round, and reactive to light.  Neck: Normal range of motion. Neck supple.  Pulmonary/Chest: Effort normal.  Musculoskeletal: She exhibits no edema.  Minimal to no tenderness along the lumbar spine. There are 3 or 4 points of mild to moderate tenderness at the lateral aspects of the lateral processes. No percussion tenderness. No  paralumbar muscular tenderness. Leaning forward to approximately 80 produces pain however and no substantial tenderness. No step off deformity or abnormal mobility of the lower spine.  Neurological: She is alert and oriented to person, place, and time. She has normal reflexes. No cranial nerve deficit. She exhibits normal muscle tone. Coordination normal.  Skin: Skin is warm and dry.  Psychiatric: She has a normal mood and affect.    ED Course   Procedures (including critical care time)  Labs Reviewed  POCT URINALYSIS DIP (DEVICE) - Abnormal; Notable for the following:    Ketones, ur 15 (*)    All other components within normal limits  POCT PREGNANCY, URINE   No results found. 1. Lumbar spine pain     MDM  Instructions on stretching. Apply ice to the area of pain over the lumbar spine Ultram 50 mg q. 4-6 hours when necessary pain Medrol Dosepak as directed Limit lifting as much as possible. Call the above number to assist in obtaining a physician. May need physical therapy.   Hayden Rasmussen, NP 08/15/12 1626  Hayden Rasmussen, NP 08/15/12 [phone]

## 2013-11-17 ENCOUNTER — Encounter (HOSPITAL_COMMUNITY): Payer: Self-pay | Admitting: Emergency Medicine

## 2013-12-03 ENCOUNTER — Emergency Department (INDEPENDENT_AMBULATORY_CARE_PROVIDER_SITE_OTHER)
Admission: EM | Admit: 2013-12-03 | Discharge: 2013-12-03 | Disposition: A | Discharge status: Home / Self Care | Payer: Self-pay | Source: Home / Self Care | Attending: Family Medicine | Admitting: Family Medicine

## 2013-12-03 ENCOUNTER — Encounter (HOSPITAL_COMMUNITY): Payer: Self-pay | Admitting: *Deleted

## 2013-12-03 DIAGNOSIS — J4520 Mild intermittent asthma, uncomplicated: Secondary | ICD-10-CM

## 2013-12-03 MED ORDER — IPRATROPIUM BROMIDE 0.06 % NA SOLN: 2.0000 | Freq: Four times a day (QID) | NASAL | Status: DC

## 2013-12-03 MED ORDER — BENZONATATE 100 MG PO CAPS: 100.0000 mg | ORAL_CAPSULE | Freq: Three times a day (TID) | ORAL | Status: DC | PRN

## 2013-12-03 MED ORDER — PREDNISONE 10 MG PO TABS: 20.0000 mg | ORAL_TABLET | Freq: Every day | ORAL | Status: DC

## 2013-12-03 MED ORDER — ALBUTEROL SULFATE HFA 108 (90 BASE) MCG/ACT IN AERS: 1.0000 | INHALATION_SPRAY | Freq: Four times a day (QID) | RESPIRATORY_TRACT | Status: DC | PRN

## 2013-12-03 NOTE — ED Notes (Signed)
Pt  Reports    Symptoms of   Cough   / congestion          -  Symptoms for  Almost    2  Weeks       Child ill  At home with  Similar  Symptoms       -  Pt  Masked    Sitting up in   Room     Speaks  In complete  sentances  And  Is in no acute  Distress

## 2013-12-03 NOTE — ED Provider Notes (Signed)
CSN: [id number]     Arrival date & time 12/03/13  [id number] History   First MD Initiated Contact with Patient 12/03/13 [phone]     Chief Complaint  Patient presents with  . URI   (Consider location/radiation/quality/duration/timing/severity/associated sxs/prior Treatment) HPI Comments: Patient reports herself to be otherwise healthy. States her son began daycare 2 weeks ago and she has had trouble with recurrent cough every since. Reports occasional wheezing at night Denies fever Nonsmoker Works at [employer] PCP: none  Patient is a 28 y.o. female presenting with URI. The history is provided by the patient.  URI Presenting symptoms: congestion, cough and rhinorrhea   Presenting symptoms: no fatigue, no fever and no sore throat   Presenting symptoms comment:  +post nasal drainage Severity:  Mild Onset quality:  Gradual Duration:  2 weeks Timing:  Intermittent Chronicity:  New   Past Medical History  Diagnosis Date  . Sickle cell trait   . PID (acute pelvic inflammatory disease)     as teenager   Past Surgical History  Procedure Laterality Date  . No past surgeries    . Cesarean section  10/31/2011    Procedure: CESAREAN SECTION;  Surgeon: Brock Badharles A Harper, MD;  Location: [facility];  Service: Obstetrics;  Laterality: N/A;   Family History  Problem Relation Age of Onset  . Anesthesia problems Neg Hx   . Hypotension Neg Hx   . Malignant hyperthermia Neg Hx   . Pseudochol deficiency Neg Hx   . Other Neg Hx    History  Substance Use Topics  . Smoking status: Never Smoker   . Smokeless tobacco: Never Used  . Alcohol Use: No   OB History    Gravida Para Term Preterm AB TAB SAB Ectopic Multiple Living   1 1 0 1 0 0 0 0 0 1      Review of Systems  Constitutional: Negative for fever and fatigue.  HENT: Positive for congestion and rhinorrhea. Negative for sore throat.   Eyes: Negative.   Respiratory: Positive for cough. Negative for chest tightness and shortness of breath.    Cardiovascular: Negative.   Gastrointestinal: Negative.   Musculoskeletal: Negative for back pain.    Allergies  Review of patient's allergies indicates no known allergies.  Home Medications   Prior to Admission medications   Medication Sig Start Date End Date Taking? Authorizing Provider  albuterol (PROVENTIL HFA;VENTOLIN HFA) 108 (90 BASE) MCG/ACT inhaler Inhale 1-2 puffs into the lungs every 6 (six) hours as needed for wheezing or shortness of breath (or persistent coughing). 12/03/13   Mathis FareJennifer Lee H Shaheer Bonfield, PA  benzonatate (TESSALON) 100 MG capsule Take 1 capsule (100 mg total) by mouth 3 (three) times daily as needed for cough. 12/03/13   Mathis FareJennifer Lee H Ajeet Casasola, PA  cholecalciferol (VITAMIN D) 400 UNITS TABS Take 400 Units by mouth every morning.    Historical Provider, MD  ibuprofen (ADVIL,MOTRIN) 600 MG tablet Take 1 tablet (600 mg total) by mouth every 6 (six) hours. 11/03/11   Brock Badharles A Harper, MD  ipratropium (ATROVENT) 0.06 % nasal spray Place 2 sprays into both nostrils 4 (four) times daily. 12/03/13   Ria ClockJennifer Lee H Adair Lauderback, PA  Iron-FA-B Cmp-C-Biot-Probiotic (FUSION PLUS) CAPS Take 1 capsule by mouth daily before breakfast. 11/03/11   Brock Badharles A Harper, MD  methylPREDNISolone (MEDROL DOSEPAK) 4 MG tablet follow package directions. Take with food 08/15/12   Hayden Rasmussenavid Mabe, NP  oxyCODONE-acetaminophen (PERCOCET/ROXICET) 5-325 MG per tablet Take 1-2 tablets by mouth every 4 (  four) hours as needed (moderate - severe pain). 11/03/11   Brock Badharles A Harper, MD  predniSONE (DELTASONE) 10 MG tablet Take 2 tablets (20 mg total) by mouth daily. X 4 days 12/03/13   Ria ClockJennifer Lee H Javanni Maring, PA  Prenatal Vit-Fe Fumarate-FA (MULTIVITAMIN-PRENATAL) 27-0.8 MG TABS Take 1 tablet by mouth daily with breakfast.    Historical Provider, MD  traMADol (ULTRAM) 50 MG tablet Take 1 tablet (50 mg total) by mouth every 6 (six) hours as needed for pain. 08/15/12   Hayden Rasmussenavid Mabe, NP   BP 144/81 mmHg  Pulse 92   Temp(Src) 98.1 F (36.7 C) (Oral)  Resp 16  SpO2 100%  LMP 11/26/2013 Physical Exam  Constitutional: She is oriented to person, place, and time. She appears well-developed and well-nourished. No distress.  HENT:  Head: Normocephalic and atraumatic.  Right Ear: Hearing, tympanic membrane, external ear and ear canal normal.  Left Ear: Hearing, tympanic membrane, external ear and ear canal normal.  Nose: Nose normal.  Mouth/Throat: Uvula is midline and mucous membranes are normal. Posterior oropharyngeal erythema present. No oropharyngeal exudate, posterior oropharyngeal edema or tonsillar abscesses.  Eyes: Conjunctivae are normal. No scleral icterus.  Neck: Normal range of motion. Neck supple.  Cardiovascular: Normal rate, regular rhythm and normal heart sounds.   Pulmonary/Chest: Effort normal and breath sounds normal. No respiratory distress. She has no wheezes.  Musculoskeletal: Normal range of motion.  Lymphadenopathy:    She has no cervical adenopathy.  Neurological: She is alert and oriented to person, place, and time.  Skin: Skin is warm and dry.  Psychiatric: She has a normal mood and affect. Her behavior is normal.  Nursing note and vitals reviewed.   ED Course  Procedures (including critical care time) Labs Review Labs Reviewed - No data to display  Imaging Review No results found.   MDM   1. Asthmatic bronchitis, mild intermittent, uncomplicated    Asthmatic bronchitis Will treat with tessalon, atrovent, prednisone taper and albuterol MDI Advise follow up if no improvement.    Mathis FareJennifer Lee H BartowPresson, GeorgiaPA 12/03/13 [phone]

## 2013-12-03 NOTE — Discharge Instructions (Signed)
How to Use an Inhaler Proper inhaler technique is very important. Good technique ensures that the medicine reaches the lungs. Poor technique results in depositing the medicine on the tongue and back of the throat rather than in the airways. If you do not use the inhaler with good technique, the medicine will not help you. STEPS TO FOLLOW IF USING AN INHALER WITHOUT AN EXTENSION TUBE 1. Remove the cap from the inhaler. 2. If you are using the inhaler for the first time, you will need to prime it. Shake the inhaler for 5 seconds and release four puffs into the air, away from your face. Ask your health care provider or pharmacist if you have questions about priming your inhaler. 3. Shake the inhaler for 5 seconds before each breath in (inhalation). 4. Position the inhaler so that the top of the canister faces up. 5. Put your index finger on the top of the medicine canister. Your thumb supports the bottom of the inhaler. 6. Open your mouth. 7. Either place the inhaler between your teeth and place your lips tightly around the mouthpiece, or hold the inhaler 1-2 inches away from your open mouth. If you are unsure of which technique to use, ask your health care provider. 8. Breathe out (exhale) normally and as completely as possible. 9. Press the canister down with your index finger to release the medicine. 10. At the same time as the canister is pressed, inhale deeply and slowly until your lungs are completely filled. This should take 4-6 seconds. Keep your tongue down. 11. Hold the medicine in your lungs for 5-10 seconds (10 seconds is best). This helps the medicine get into the small airways of your lungs. 12. Breathe out slowly, through pursed lips. Whistling is an example of pursed lips. 13. Wait at least 15-30 seconds between puffs. Continue with the above steps until you have taken the number of puffs your health care provider has ordered. Do not use the inhaler more than your health care provider  tells you. 14. Replace the cap on the inhaler. 15. Follow the directions from your health care provider or the inhaler insert for cleaning the inhaler. STEPS TO FOLLOW IF USING AN INHALER WITH AN EXTENSION (SPACER) 1. Remove the cap from the inhaler. 2. If you are using the inhaler for the first time, you will need to prime it. Shake the inhaler for 5 seconds and release four puffs into the air, away from your face. Ask your health care provider or pharmacist if you have questions about priming your inhaler. 3. Shake the inhaler for 5 seconds before each breath in (inhalation). 4. Place the open end of the spacer onto the mouthpiece of the inhaler. 5. Position the inhaler so that the top of the canister faces up and the spacer mouthpiece faces you. 6. Put your index finger on the top of the medicine canister. Your thumb supports the bottom of the inhaler and the spacer. 7. Breathe out (exhale) normally and as completely as possible. 8. Immediately after exhaling, place the spacer between your teeth and into your mouth. Close your lips tightly around the spacer. 9. Press the canister down with your index finger to release the medicine. 10. At the same time as the canister is pressed, inhale deeply and slowly until your lungs are completely filled. This should take 4-6 seconds. Keep your tongue down and out of the way. 11. Hold the medicine in your lungs for 5-10 seconds (10 seconds is best). This helps the   medicine get into the small airways of your lungs. Exhale. 12. Repeat inhaling deeply through the spacer mouthpiece. Again hold that breath for up to 10 seconds (10 seconds is best). Exhale slowly. If it is difficult to take this second deep breath through the spacer, breathe normally several times through the spacer. Remove the spacer from your mouth. 13. Wait at least 15-30 seconds between puffs. Continue with the above steps until you have taken the number of puffs your health care provider has  ordered. Do not use the inhaler more than your health care provider tells you. 14. Remove the spacer from the inhaler, and place the cap on the inhaler. 15. Follow the directions from your health care provider or the inhaler insert for cleaning the inhaler and spacer. If you are using different kinds of inhalers, use your quick relief medicine to open the airways 10-15 minutes before using a steroid if instructed to do so by your health care provider. If you are unsure which inhalers to use and the order of using them, ask your health care provider, nurse, or respiratory therapist. If you are using a steroid inhaler, always rinse your mouth with water after your last puff, then gargle and spit out the water. Do not swallow the water. AVOID:  Inhaling before or after starting the spray of medicine. It takes practice to coordinate your breathing with triggering the spray.  Inhaling through the nose (rather than the mouth) when triggering the spray. HOW TO DETERMINE IF YOUR INHALER IS FULL OR NEARLY EMPTY You cannot know when an inhaler is empty by shaking it. A few inhalers are now being made with dose counters. Ask your health care provider for a prescription that has a dose counter if you feel you need that extra help. If your inhaler does not have a counter, ask your health care provider to help you determine the date you need to refill your inhaler. Write the refill date on a calendar or your inhaler canister. Refill your inhaler 7-10 days before it runs out. Be sure to keep an adequate supply of medicine. This includes making sure it is not expired, and that you have a spare inhaler.  SEEK MEDICAL CARE IF:   Your symptoms are only partially relieved with your inhaler.  You are having trouble using your inhaler.  You have some increase in phlegm. SEEK IMMEDIATE MEDICAL CARE IF:   You feel little or no relief with your inhalers. You are still wheezing and are feeling shortness of breath or  tightness in your chest or both.  You have dizziness, headaches, or a fast heart rate.  You have chills, fever, or night sweats.  You have a noticeable increase in phlegm production, or there is blood in the phlegm. MAKE SURE YOU:   Understand these instructions.  Will watch your condition.  Will get help right away if you are not doing well or get worse. Document Released: 12/31/1999 Document Revised: 10/23/2012 Document Reviewed: 08/01/2012 ExitCare Patient Information 2015 [employer]. This information is not intended to replace advice given to you by your health care provider. Make sure you discuss any questions you have with your health care provider.  

## 2014-01-05 ENCOUNTER — Encounter (HOSPITAL_COMMUNITY): Payer: Self-pay | Admitting: Family Medicine

## 2014-01-05 ENCOUNTER — Emergency Department (HOSPITAL_COMMUNITY)
Admission: EM | Admit: 2014-01-05 | Discharge: 2014-01-05 | Disposition: A | Discharge status: Home / Self Care | Payer: Medicaid Other | Attending: Emergency Medicine | Admitting: Emergency Medicine

## 2014-01-05 ENCOUNTER — Emergency Department (HOSPITAL_COMMUNITY): Payer: Medicaid Other

## 2014-01-05 DIAGNOSIS — R1030 Lower abdominal pain, unspecified: Secondary | ICD-10-CM | POA: Insufficient documentation

## 2014-01-05 DIAGNOSIS — O9989 Other specified diseases and conditions complicating pregnancy, childbirth and the puerperium: Secondary | ICD-10-CM | POA: Insufficient documentation

## 2014-01-05 DIAGNOSIS — Z7952 Long term (current) use of systemic steroids: Secondary | ICD-10-CM | POA: Insufficient documentation

## 2014-01-05 DIAGNOSIS — Z791 Long term (current) use of non-steroidal anti-inflammatories (NSAID): Secondary | ICD-10-CM | POA: Insufficient documentation

## 2014-01-05 DIAGNOSIS — Z79899 Other long term (current) drug therapy: Secondary | ICD-10-CM | POA: Insufficient documentation

## 2014-01-05 DIAGNOSIS — Z3A01 Less than 8 weeks gestation of pregnancy: Secondary | ICD-10-CM | POA: Insufficient documentation

## 2014-01-05 DIAGNOSIS — O200 Threatened abortion: Secondary | ICD-10-CM | POA: Insufficient documentation

## 2014-01-05 DIAGNOSIS — N939 Abnormal uterine and vaginal bleeding, unspecified: Secondary | ICD-10-CM

## 2014-01-05 DIAGNOSIS — O99011 Anemia complicating pregnancy, first trimester: Secondary | ICD-10-CM | POA: Insufficient documentation

## 2014-01-05 DIAGNOSIS — D573 Sickle-cell trait: Secondary | ICD-10-CM | POA: Insufficient documentation

## 2014-01-05 LAB — CBC
HCT: 36.5 % (ref 36.0–46.0)
Hemoglobin: 12.3 g/dL (ref 12.0–15.0)
MCH: 25.1 pg — ABNORMAL LOW (ref 26.0–34.0)
MCHC: 33.7 g/dL (ref 30.0–36.0)
MCV: 74.3 fL — AB (ref 78.0–100.0)
PLATELETS: 267 10*3/uL (ref 150–400)
RBC: 4.91 MIL/uL (ref 3.87–5.11)
RDW: 18.1 % — AB (ref 11.5–15.5)
WBC: 10.1 10*3/uL (ref 4.0–10.5)

## 2014-01-05 LAB — HCG, QUANTITATIVE, PREGNANCY: HCG, BETA CHAIN, QUANT, S: 22305 m[IU]/mL — AB (ref <5)

## 2014-01-05 LAB — WET PREP, GENITAL
Clue Cells Wet Prep HPF POC: NONE SEEN
Trich, Wet Prep: NONE SEEN
YEAST WET PREP: NONE SEEN

## 2014-01-05 NOTE — ED Notes (Signed)
Dr. Goldston at bedside.  

## 2014-01-05 NOTE — ED Notes (Signed)
Pt c/o lower abd cramping. Pt denies pain med at this time.

## 2014-01-05 NOTE — ED Notes (Signed)
Patient transported to Ultrasound 

## 2014-01-05 NOTE — Discharge Instructions (Signed)
Vaginal Bleeding During Pregnancy, First Trimester °A small amount of bleeding (spotting) from the vagina is relatively common in early pregnancy. It usually stops on its own. Various things may cause bleeding or spotting in early pregnancy. Some bleeding may be related to the pregnancy, and some may not. In most cases, the bleeding is normal and is not a problem. However, bleeding can also be a sign of something serious. Be sure to tell your health care provider about any vaginal bleeding right away. °Some possible causes of vaginal bleeding during the first trimester include: °· Infection or inflammation of the cervix. °· Growths (polyps) on the cervix. °· Miscarriage or threatened miscarriage. °· Pregnancy tissue has developed outside of the uterus and in a fallopian tube (tubal pregnancy). °· Tiny cysts have developed in the uterus instead of pregnancy tissue (molar pregnancy). °HOME CARE INSTRUCTIONS  °Watch your condition for any changes. The following actions may help to lessen any discomfort you are feeling: °· Follow your health care provider's instructions for limiting your activity. If your health care provider orders bed rest, you may need to stay in bed and only get up to use the bathroom. However, your health care provider may allow you to continue light activity. °· If needed, make plans for someone to help with your regular activities and responsibilities while you are on bed rest. °· Keep track of the number of pads you use each day, how often you change pads, and how soaked (saturated) they are. Write this down. °· Do not use tampons. Do not douche. °· Do not have sexual intercourse or orgasms until approved by your health care provider. °· If you pass any tissue from your vagina, save the tissue so you can show it to your health care provider. °· Only take over-the-counter or prescription medicines as directed by your health care provider. °· Do not take aspirin because it can make you  bleed. °· Keep all follow-up appointments as directed by your health care provider. °SEEK MEDICAL CARE IF: °· You have any vaginal bleeding during any part of your pregnancy. °· You have cramps or labor pains. °· You have a fever, not controlled by medicine. °SEEK IMMEDIATE MEDICAL CARE IF:  °· You have severe cramps in your back or belly (abdomen). °· You pass large clots or tissue from your vagina. °· Your bleeding increases. °· You feel light-headed or weak, or you have fainting episodes. °· You have chills. °· You are leaking fluid or have a gush of fluid from your vagina. °· You pass out while having a bowel movement. °MAKE SURE YOU: °· Understand these instructions. °· Will watch your condition. °· Will get help right away if you are not doing well or get worse. °Document Released: 10/12/2004 Document Revised: 01/07/2013 Document Reviewed: 09/09/2012 °ExitCare® Patient Information ©2015 [employer]. This information is not intended to replace advice given to you by your health care provider. Make sure you discuss any questions you have with your health care provider. °Threatened Miscarriage °A threatened miscarriage occurs when you have vaginal bleeding during your first 20 weeks of pregnancy but the pregnancy has not ended. If you have vaginal bleeding during this time, your health care provider will do tests to make sure you are still pregnant. If the tests show you are still pregnant and the developing baby (fetus) inside your womb (uterus) is still growing, your condition is considered a threatened miscarriage. °A threatened miscarriage does not mean your pregnancy will end, but it does   increase the risk of losing your pregnancy (complete miscarriage). °CAUSES  °The cause of a threatened miscarriage is usually not known. If you go on to have a complete miscarriage, the most common cause is an abnormal number of chromosomes in the developing baby. Chromosomes are the structures inside cells that hold all  your genetic material. °Some causes of vaginal bleeding that do not result in miscarriage include: °· Having sex. °· Having an infection. °· Normal hormone changes of pregnancy. °· Bleeding that occurs when an egg implants in your uterus. °RISK FACTORS °Risk factors for bleeding in early pregnancy include: °· Obesity. °· Smoking. °· Drinking excessive amounts of alcohol or caffeine. °· Recreational drug use. °SIGNS AND SYMPTOMS °· Light vaginal bleeding. °· Mild abdominal pain or cramps. °DIAGNOSIS  °If you have bleeding with or without abdominal pain before 20 weeks of pregnancy, your health care provider will do tests to check whether you are still pregnant. One important test involves using sound waves and a computer (ultrasound) to create images of the inside of your uterus. Other tests include an internal exam of your vagina and uterus (pelvic exam) and measurement of your baby's heart rate.  °You may be diagnosed with a threatened miscarriage if: °· Ultrasound testing shows you are still pregnant. °· Your baby's heart rate is strong. °· A pelvic exam shows that the opening between your uterus and your vagina (cervix) is closed. °· Your heart rate and blood pressure are stable. °· Blood tests confirm you are still pregnant. °TREATMENT  °No treatments have been shown to prevent a threatened miscarriage from going on to a complete miscarriage. However, the right home care is important.  °HOME CARE INSTRUCTIONS  °· Make sure you keep all your appointments for prenatal care. This is very important. °· Get plenty of rest. °· Do not have sex or use tampons if you have vaginal bleeding. °· Do not douche. °· Do not smoke or use recreational drugs. °· Do not drink alcohol. °· Avoid caffeine. °SEEK MEDICAL CARE IF: °· You have light vaginal bleeding or spotting while pregnant. °· You have abdominal pain or cramping. °· You have a fever. °SEEK IMMEDIATE MEDICAL CARE IF: °· You have heavy vaginal bleeding. °· You have  blood clots coming from your vagina. °· You have severe low back pain or abdominal cramps. °· You have fever, chills, and severe abdominal pain. °MAKE SURE YOU: °· Understand these instructions. °· Will watch your condition. °· Will get help right away if you are not doing well or get worse. °Document Released: 01/02/2005 Document Revised: 01/07/2013 Document Reviewed: 10/29/2012 °ExitCare® Patient Information ©2015 [employer]. This information is not intended to replace advice given to you by your health care provider. Make sure you discuss any questions you have with your health care provider. ° ° °

## 2014-01-05 NOTE — ED Notes (Signed)
Pt returned back in room, pt placed on monitor

## 2014-01-05 NOTE — ED Notes (Signed)
Per pt sts she took a pregnancy test a week or 2 ago and was positive. sts today after sex she began bleeding heavy and some abnormal tissue came out. Pt has wrapped in napkin at triage. Denies pain. sts light headed.

## 2014-01-05 NOTE — ED Provider Notes (Signed)
CSN: [id number]     Arrival date & time 01/05/14  1814 History   First MD Initiated Contact with Patient 01/05/14 1942     Chief Complaint  Patient presents with  . Vaginal Bleeding     (Consider location/radiation/quality/duration/timing/severity/associated sxs/prior Treatment) HPI  28 year old female presents with vaginal bleeding since about 1-2 hours ago. Last week she took a pregnancy test was positive. Today she was having intercourse and afterwards noticed a heavy amount of bleeding and some tissue that came out. Has been having mild lower abdominal cramping during this time. The bleeding is slowed down but not stopped. Denies any urinary symptoms. Felt lightheaded prior to coming into the ER but now feels normal. Her blood type is O+. She's been pregnant once before and had a C-section. No other pregnancies besides this one. Rates her lower abdominal pain as mild. Is localized in the midline.  Past Medical History  Diagnosis Date  . Sickle cell trait   . PID (acute pelvic inflammatory disease)     as teenager   Past Surgical History  Procedure Laterality Date  . No past surgeries    . Cesarean section  10/31/2011    Procedure: CESAREAN SECTION;  Surgeon: Brock Badharles A Harper, MD;  Location: [facility];  Service: Obstetrics;  Laterality: N/A;   Family History  Problem Relation Age of Onset  . Anesthesia problems Neg Hx   . Hypotension Neg Hx   . Malignant hyperthermia Neg Hx   . Pseudochol deficiency Neg Hx   . Other Neg Hx    History  Substance Use Topics  . Smoking status: Never Smoker   . Smokeless tobacco: Never Used  . Alcohol Use: No   OB History    Gravida Para Term Preterm AB TAB SAB Ectopic Multiple Living   1 1 0 1 0 0 0 0 0 1      Review of Systems  Gastrointestinal: Positive for abdominal pain. Negative for vomiting.  Genitourinary: Positive for vaginal bleeding. Negative for dysuria and vaginal discharge.  Neurological: Positive for light-headedness.  All  other systems reviewed and are negative.     Allergies  Review of patient's allergies indicates no known allergies.  Home Medications   Prior to Admission medications   Medication Sig Start Date End Date Taking? Authorizing Provider  albuterol (PROVENTIL HFA;VENTOLIN HFA) 108 (90 BASE) MCG/ACT inhaler Inhale 1-2 puffs into the lungs every 6 (six) hours as needed for wheezing or shortness of breath (or persistent coughing). 12/03/13   Mathis FareJennifer Lee H Presson, PA  benzonatate (TESSALON) 100 MG capsule Take 1 capsule (100 mg total) by mouth 3 (three) times daily as needed for cough. 12/03/13   Mathis FareJennifer Lee H Presson, PA  cholecalciferol (VITAMIN D) 400 UNITS TABS Take 400 Units by mouth every morning.    Historical Provider, MD  ibuprofen (ADVIL,MOTRIN) 600 MG tablet Take 1 tablet (600 mg total) by mouth every 6 (six) hours. 11/03/11   Brock Badharles A Harper, MD  ipratropium (ATROVENT) 0.06 % nasal spray Place 2 sprays into both nostrils 4 (four) times daily. 12/03/13   Ria ClockJennifer Lee H Presson, PA  Iron-FA-B Cmp-C-Biot-Probiotic (FUSION PLUS) CAPS Take 1 capsule by mouth daily before breakfast. 11/03/11   Brock Badharles A Harper, MD  methylPREDNISolone (MEDROL DOSEPAK) 4 MG tablet follow package directions. Take with food 08/15/12   Hayden Rasmussenavid Mabe, NP  oxyCODONE-acetaminophen (PERCOCET/ROXICET) 5-325 MG per tablet Take 1-2 tablets by mouth every 4 (four) hours as needed (moderate - severe pain). 11/03/11  Brock Badharles A Harper, MD  predniSONE (DELTASONE) 10 MG tablet Take 2 tablets (20 mg total) by mouth daily. X 4 days 12/03/13   Ria ClockJennifer Lee H Presson, PA  Prenatal Vit-Fe Fumarate-FA (MULTIVITAMIN-PRENATAL) 27-0.8 MG TABS Take 1 tablet by mouth daily with breakfast.    Historical Provider, MD  traMADol (ULTRAM) 50 MG tablet Take 1 tablet (50 mg total) by mouth every 6 (six) hours as needed for pain. 08/15/12   Hayden Rasmussenavid Mabe, NP   BP 133/71 mmHg  Pulse 75  Temp(Src) 98.3 F (36.8 C)  Resp 18  SpO2 98%  LMP  11/22/2013 Physical Exam  Constitutional: She is oriented to person, place, and time. She appears well-developed and well-nourished.  HENT:  Head: Normocephalic and atraumatic.  Right Ear: External ear normal.  Left Ear: External ear normal.  Nose: Nose normal.  Eyes: Right eye exhibits no discharge. Left eye exhibits no discharge.  Cardiovascular: Normal rate, regular rhythm and normal heart sounds.   Pulmonary/Chest: Effort normal and breath sounds normal.  Abdominal: Soft. There is tenderness (mild) in the suprapubic area.  Genitourinary: Uterus is not tender. Cervix exhibits no motion tenderness. Right adnexum displays no mass and no tenderness. Left adnexum displays no mass and no tenderness. There is bleeding in the vagina.  Cervical os is open with mild bleeding  Neurological: She is alert and oriented to person, place, and time.  Skin: Skin is warm and dry.  Nursing note and vitals reviewed.   ED Course  Procedures (including critical care time) Labs Review Labs Reviewed  WET PREP, GENITAL - Abnormal; Notable for the following:    WBC, Wet Prep HPF POC FEW (*)    All other components within normal limits  HCG, QUANTITATIVE, PREGNANCY - Abnormal; Notable for the following:    hCG, Beta Chain, Quant, S 22305 (*)    All other components within normal limits  CBC - Abnormal; Notable for the following:    MCV 74.3 (*)    MCH 25.1 (*)    RDW 18.1 (*)    All other components within normal limits  GC/CHLAMYDIA PROBE AMP    Imaging Review [place] Ob Comp Less 14 Wks  01/05/2014   CLINICAL DATA:  First trimester pregnancy. Vaginal bleeding. LMP 11/22/2013.  EXAM: OBSTETRIC <14 WK US AND TRANSVAGINAL OB US  TECHNIQUE: Both transabdominal and transvaginal ultrasound examinations were performed for complete evaluation of the gestation as well as the maternal uterus, adnexal regions, and pelvic cul-de-sac. Transvaginal technique was performed to assess early pregnancy.  COMPARISON:   None.  FINDINGS: Intrauterine gestational sac: Visualized/normal in shape.  Yolk sac:  Visualized.  Embryo: There is a questionable small embryo with a crown-rump length of 2 mm.  Cardiac Activity: Not visualized.  MSD:  15.8  mm   6 w   3  d  CRL:   2  mm   5 w 5 d  Maternal uterus/adnexae: There is a small subchorionic hematoma. Both maternal ovaries are visualized and appear normal. No adnexal mass is demonstrated. There is a small amount of free pelvic fluid.  IMPRESSION: Early intrauterine gestational sac with a yolk sac and possible fetal pole, but no cardiac activity yet visualized. Dating by mean sac diameter and crown-rump length is somewhat discordant. Small subchorionic hematoma. Findings are suspicious but not yet definitive for failed pregnancy. Recommend follow-up US in 10-14 days for definitive diagnosis. This recommendation follows SRU consensus guidelines: Diagnostic Criteria for Nonviable Pregnancy Early in the First Trimester. N  Dola Factor Med 2013; 369:1443-51.   Electronically Signed   By: Roxy Horseman M.D.   On: 01/05/2014 22:27   US Ob Transvaginal  01/05/2014   CLINICAL DATA:  First trimester pregnancy. Vaginal bleeding. LMP 11/22/2013.  EXAM: OBSTETRIC <14 WK [place] AND TRANSVAGINAL OB US  TECHNIQUE: Both transabdominal and transvaginal ultrasound examinations were performed for complete evaluation of the gestation as well as the maternal uterus, adnexal regions, and pelvic cul-de-sac. Transvaginal technique was performed to assess early pregnancy.  COMPARISON:  None.  FINDINGS: Intrauterine gestational sac: Visualized/normal in shape.  Yolk sac:  Visualized.  Embryo: There is a questionable small embryo with a crown-rump length of 2 mm.  Cardiac Activity: Not visualized.  MSD:  15.8  mm   6 w   3  d  CRL:   2  mm   5 w 5 d  Maternal uterus/adnexae: There is a small subchorionic hematoma. Both maternal ovaries are visualized and appear normal. No adnexal mass is demonstrated. There is a small  amount of free pelvic fluid.  IMPRESSION: Early intrauterine gestational sac with a yolk sac and possible fetal pole, but no cardiac activity yet visualized. Dating by mean sac diameter and crown-rump length is somewhat discordant. Small subchorionic hematoma. Findings are suspicious but not yet definitive for failed pregnancy. Recommend follow-up US in 10-14 days for definitive diagnosis. This recommendation follows SRU consensus guidelines: Diagnostic Criteria for Nonviable Pregnancy Early in the First Trimester. Malva Limes Med 2013; 595:6387-56.   Electronically Signed   By: Roxy Horseman M.D.   On: 01/05/2014 22:27     EKG Interpretation None      MDM   Final diagnoses:  Threatened miscarriage    The ultrasound shows an intrauterine pregnancy but no cardiac activity. Difficult to tell this due to early age versus miscarriage. No definitive miscarriage at this time. Patient's blood type is O+. At this point her hemoglobin is stable and she has minimal to no pain. Will refer to her gynecologist for outpatient follow-up.    Audree Camel, MD 01/05/14 2352

## 2014-01-06 LAB — GC/CHLAMYDIA PROBE AMP
CT PROBE, AMP APTIMA: NEGATIVE
GC PROBE AMP APTIMA: NEGATIVE

## 2015-08-25 ENCOUNTER — Encounter (HOSPITAL_COMMUNITY): Payer: Self-pay | Admitting: Emergency Medicine

## 2015-08-25 ENCOUNTER — Ambulatory Visit (INDEPENDENT_AMBULATORY_CARE_PROVIDER_SITE_OTHER): Payer: Self-pay

## 2015-08-25 ENCOUNTER — Ambulatory Visit (HOSPITAL_COMMUNITY)
Admission: EM | Admit: 2015-08-25 | Discharge: 2015-08-25 | Disposition: A | Discharge status: Home / Self Care | Payer: Medicaid Other | Attending: Family Medicine | Admitting: Family Medicine

## 2015-08-25 DIAGNOSIS — S99921A Unspecified injury of right foot, initial encounter: Secondary | ICD-10-CM

## 2015-08-25 DIAGNOSIS — T1490 Injury, unspecified: Secondary | ICD-10-CM

## 2015-08-25 MED ORDER — IBUPROFEN 800 MG PO TABS: 800.0000 mg | ORAL_TABLET | Freq: Three times a day (TID) | ORAL | 0 refills | Status: DC

## 2015-08-25 NOTE — ED Triage Notes (Signed)
[facility] a [facility] pain secondary to a fall down 9 stairs this am. The patient stated that her foot bent backwards underneath her.

## 2015-08-25 NOTE — ED Provider Notes (Signed)
CSN: [id number]     Arrival date & time 08/25/15  1202 History   First MD Initiated Contact with Patient 08/25/15 1251     Chief Complaint  Patient presents with  . Foot Injury   (Consider location/radiation/quality/duration/timing/severity/associated sxs/prior Treatment) Jennifer Burnett is a well-appearing 30 y.o female, presents today for right foot pain onset yesterday. She fell down 9 steps of stair yesterday and have since unable to bear weight on her right foot or ambulate. She reports pain at the top of her right foot with swelling, limited ROM, and pain at 10/10. She reports that the swelling has improved. She have tried tylenol, heat and cold therapy, and soaking in epsom salt for her pain without significant improvement. She denies numbness or tingling sensation.         Past Medical History:  Diagnosis Date  . PID (acute pelvic inflammatory disease)    as teenager  . Sickle cell trait [facility](HCC)    Past Surgical History:  Procedure Laterality Date  . CESAREAN SECTION  10/31/2011   Procedure: CESAREAN SECTION;  Surgeon: Brock Badharles A Harper, MD;  Location: [facility];  Service: Obstetrics;  Laterality: N/A;  . NO PAST SURGERIES     Family History  Problem Relation Age of Onset  . Anesthesia problems Neg Hx   . Hypotension Neg Hx   . Malignant hyperthermia Neg Hx   . Pseudochol deficiency Neg Hx   . Other Neg Hx    Social History  Substance Use Topics  . Smoking status: Never Smoker  . Smokeless tobacco: Never Used  . Alcohol use No   OB History    Gravida Para Term Preterm AB Living   1 1 0 1 0 1   SAB TAB Ectopic Multiple Live Births   0 0 0 0 1     Review of Systems  Musculoskeletal:       Positive for right foot pain secondary to a fall (see HPI), Pos for swelling and limited ROM. Pos for unable to bear weight  All other systems reviewed and are negative.   Allergies  Review of patient's allergies indicates no known allergies.  Home Medications   Prior to  Admission medications   Medication Sig Start Date End Date Taking? Authorizing Provider  albuterol (PROVENTIL HFA;VENTOLIN HFA) 108 (90 BASE) MCG/ACT inhaler Inhale 1-2 puffs into the lungs every 6 (six) hours as needed for wheezing or shortness of breath (or persistent coughing). 12/03/13   Mathis FareJennifer Lee H Presson, PA  benzonatate (TESSALON) 100 MG capsule Take 1 capsule (100 mg total) by mouth 3 (three) times daily as needed for cough. 12/03/13   Mathis FareJennifer Lee H Presson, PA  ibuprofen (ADVIL,MOTRIN) 800 MG tablet Take 1 tablet (800 mg total) by mouth 3 (three) times daily. 08/25/15 08/30/15  Lucia EstelleFeng Harold Moncus, NP  ipratropium (ATROVENT) 0.06 % nasal spray Place 2 sprays into both nostrils 4 (four) times daily. 12/03/13   Ria ClockJennifer Lee H Presson, PA  Iron-FA-B Cmp-C-Biot-Probiotic (FUSION PLUS) CAPS Take 1 capsule by mouth daily before breakfast. 11/03/11   Brock Badharles A Harper, MD  methylPREDNISolone (MEDROL DOSEPAK) 4 MG tablet follow package directions. Take with food 08/15/12   Hayden Rasmussenavid Mabe, NP  oxyCODONE-acetaminophen (PERCOCET/ROXICET) 5-325 MG per tablet Take 1-2 tablets by mouth every 4 (four) hours as needed (moderate - severe pain). 11/03/11   Brock Badharles A Harper, MD  Prenatal Vit-Fe Fumarate-FA (MULTIVITAMIN-PRENATAL) 27-0.8 MG TABS Take 1 tablet by mouth daily with breakfast.    Historical Provider, MD  traMADol (ULTRAM) 50 MG tablet Take 1 tablet (50 mg total) by mouth every 6 (six) hours as needed for pain. 08/15/12   Hayden Rasmussen, NP   Meds Ordered and Administered this Visit  Medications - No data to display  BP 113/74 (BP Location: Left Arm)   Pulse 82   Temp 97.7 F (36.5 C) (Oral)   Resp 12   LMP 08/08/2015 (Exact Date)   SpO2 100%  No data found.   Physical Exam  Constitutional: She appears well-developed and well-nourished.  Cardiovascular: Normal rate.   Pulmonary/Chest: Effort normal.  Musculoskeletal:  Right foot appears to be slightly swollen compared to her left foot. Right foot has no  deformity noted. Right foot has pain on palpation especially over the metatarsals region and has imited ROM due to pain. Sensation and motor intact    Urgent Care Course   Clinical Course    Procedures (including critical care time)  Labs Review Labs Reviewed - No data to display  Imaging Review Dg Foot Complete Right  Result Date: 08/25/2015 CLINICAL DATA:  The patient reports falling down 9 stairs yesterday with a right foot injury. Pain. Initial encounter. EXAM: RIGHT FOOT COMPLETE - 3+ VIEW COMPARISON:  None. FINDINGS: There is no evidence of fracture or dislocation. There is no evidence of arthropathy or other focal bone abnormality. Soft tissues are unremarkable. IMPRESSION: Negative exam. Electronically Signed   By: Drusilla Kanner M.D.   On: 08/25/2015 13:18    MDM   1. Soft tissue injury of right foot    Jennifer Burnett is a well-appearing 30 y.o female, presents today for right foot pain onset yesterday. She fell down 9 steps of stair yesterday and have since unable to bear weight on her right foot or ambulate. She states that she have hopping relieve the pressure off her right foot. Xray of her foot was negative.  Crutches given to patient to help her ambulate. Ace wrap applied. Patient send home with ibuprofen. Work note given for 3 days. Patient instructed to rest, apply ice, use the ace wrap for the next several days, and elevate her foot. Informed to return or f/u with PCP if she does not improve.     Lucia Estelle, NP 08/25/15 1348

## 2015-08-25 NOTE — Discharge Instructions (Signed)
Treatment for a sprain or strain is RICE: R: Rest I: Ice C: Compression (ace wrap) E: elevation  Please take an easy for the next few days. The ibuprofen will help with pain and the inflammation. Please follow up with your primary care doctor if you do not improve.

## 2015-08-28 ENCOUNTER — Encounter (HOSPITAL_COMMUNITY): Payer: Self-pay | Admitting: Emergency Medicine

## 2015-08-28 ENCOUNTER — Ambulatory Visit (HOSPITAL_COMMUNITY)
Admission: EM | Admit: 2015-08-28 | Discharge: 2015-08-28 | Disposition: A | Discharge status: Home / Self Care | Payer: Medicaid Other | Attending: Family Medicine | Admitting: Family Medicine

## 2015-08-28 DIAGNOSIS — S93601D Unspecified sprain of right foot, subsequent encounter: Secondary | ICD-10-CM

## 2015-08-28 NOTE — ED Triage Notes (Signed)
Pt is here for persistent pain of right foot.... Seen here on 8/9 and treated for a sprained ankle.  Reports she needs another extension note from work b/c it's too painful to bear weight on foot... Crutches present upon arrival.   A&O x4... NAD

## 2015-08-28 NOTE — ED Provider Notes (Signed)
CSN: [id number]     Arrival date & time 08/28/15  1226 History   First MD Initiated Contact with Patient 08/28/15 1305     Chief Complaint  Patient presents with  . Foot Pain   (Consider location/radiation/quality/duration/timing/severity/associated sxs/prior Treatment) 30 year old female was seen in the urgent care 3 days ago after she had fallen and injured her right foot. X-ray revealed no bony injury. Diagnosis was sprain to the foot. She states that she is complaining of pain primarily to the forefoot at the base of the toes and to the plantar aspect. She has been treating her foot with a combination of soaking it in hot water and applying ice off and on. She has attempted to bear weight and flex the toes with weightbearing and this produces much pain and she is unable to continue with weightbearing. She has a job in which she has to stand for prolonged period time and perform a lot of walking. She states she is not able to do this yet. Denies any new injury or new complaint. She states the Ace bandage helps her to feel much better.  X-ray reviewed. No fracture  or other abdomen around the scene.      Past Medical History:  Diagnosis Date  . PID (acute pelvic inflammatory disease)    as teenager  . Sickle cell trait [facility](HCC)    Past Surgical History:  Procedure Laterality Date  . CESAREAN SECTION  10/31/2011   Procedure: CESAREAN SECTION;  Surgeon: Brock Badharles A Harper, MD;  Location: [facility];  Service: Obstetrics;  Laterality: N/A;  . NO PAST SURGERIES     Family History  Problem Relation Age of Onset  . Anesthesia problems Neg Hx   . Hypotension Neg Hx   . Malignant hyperthermia Neg Hx   . Pseudochol deficiency Neg Hx   . Other Neg Hx    Social History  Substance Use Topics  . Smoking status: Never Smoker  . Smokeless tobacco: Never Used  . Alcohol use No   OB History    Gravida Para Term Preterm AB Living   1 1 0 1 0 1   SAB TAB Ectopic Multiple Live Births   0 0 0 0 1      Review of Systems  Constitutional: Positive for activity change. Negative for chills, fatigue and fever.  HENT: Negative.   Respiratory: Negative.   Cardiovascular: Negative.   Musculoskeletal:       As per HPI  Skin: Negative for color change, pallor and rash.  Neurological: Negative.   All other systems reviewed and are negative.   Allergies  Review of patient's allergies indicates no known allergies.  Home Medications   Prior to Admission medications   Medication Sig Start Date End Date Taking? Authorizing Provider  albuterol (PROVENTIL HFA;VENTOLIN HFA) 108 (90 BASE) MCG/ACT inhaler Inhale 1-2 puffs into the lungs every 6 (six) hours as needed for wheezing or shortness of breath (or persistent coughing). 12/03/13   Mathis FareJennifer Lee H Presson, PA  benzonatate (TESSALON) 100 MG capsule Take 1 capsule (100 mg total) by mouth 3 (three) times daily as needed for cough. 12/03/13   Mathis FareJennifer Lee H Presson, PA  ibuprofen (ADVIL,MOTRIN) 800 MG tablet Take 1 tablet (800 mg total) by mouth 3 (three) times daily. 08/25/15 08/30/15  Lucia EstelleFeng Zheng, NP  ipratropium (ATROVENT) 0.06 % nasal spray Place 2 sprays into both nostrils 4 (four) times daily. 12/03/13   Mathis FareJennifer Lee H Presson, PA  Iron-FA-B Cmp-C-Biot-Probiotic (FUSION PLUS)  CAPS Take 1 capsule by mouth daily before breakfast. 11/03/11   Brock Bad, MD  methylPREDNISolone (MEDROL DOSEPAK) 4 MG tablet follow package directions. Take with food 08/15/12   Hayden Rasmussen, NP  oxyCODONE-acetaminophen (PERCOCET/ROXICET) 5-325 MG per tablet Take 1-2 tablets by mouth every 4 (four) hours as needed (moderate - severe pain). 11/03/11   Brock Bad, MD  Prenatal Vit-Fe Fumarate-FA (MULTIVITAMIN-PRENATAL) 27-0.8 MG TABS Take 1 tablet by mouth daily with breakfast.    Historical Provider, MD  traMADol (ULTRAM) 50 MG tablet Take 1 tablet (50 mg total) by mouth every 6 (six) hours as needed for pain. 08/15/12   Hayden Rasmussen, NP   Meds Ordered and  Administered this Visit  Medications - No data to display  BP 130/86 (BP Location: Left Arm)   Pulse 97   Temp 97.9 F (36.6 C) (Oral)   Resp 18   LMP 08/08/2015 (Exact Date)   SpO2 100%  No data found.   Physical Exam  Constitutional: She is oriented to person, place, and time. She appears well-developed and well-nourished. No distress.  HENT:  Head: Normocephalic and atraumatic.  Eyes: EOM are normal. Pupils are equal, round, and reactive to light.  Neck: Normal range of motion. Neck supple.  Musculoskeletal:  Right foot with no apparent swelling. No tenderness or pain to the ankle. There continues to be tenderness to the dorsum of the forefoot and at the base of the toes. No digital tenderness. No deformity or discoloration. Distal neurovascular motor sensory is intact. Pedal pulses 2+.  Lymphadenopathy:    She has no cervical adenopathy.  Neurological: She is alert and oriented to person, place, and time. No cranial nerve deficit.  Skin: Skin is warm and dry.  Psychiatric: She has a normal mood and affect.  Nursing note and vitals reviewed.   Urgent Care Course   Clinical Course    Procedures (including critical care time)  Labs Review Labs Reviewed - No data to display  Imaging Review No results found.   Visual Acuity Review  Right Eye Distance:   Left Eye Distance:   Bilateral Distance:    Right Eye Near:   Left Eye Near:    Bilateral Near:         MDM   1. Foot sprain, right, subsequent encounter    Use ice only, elevate and wear ACE for support and comfort, No weight bearing for now, use crutches then start lightly as tolerated placing weight on the heel first.     Hayden Rasmussen, NP 08/28/15 1331

## 2015-09-02 ENCOUNTER — Ambulatory Visit (HOSPITAL_COMMUNITY)
Admission: EM | Admit: 2015-09-02 | Discharge: 2015-09-02 | Disposition: A | Discharge status: Home / Self Care | Payer: Medicaid Other | Attending: Family Medicine | Admitting: Family Medicine

## 2015-09-02 ENCOUNTER — Encounter (HOSPITAL_COMMUNITY): Payer: Self-pay | Admitting: Family Medicine

## 2015-09-02 DIAGNOSIS — S93601D Unspecified sprain of right foot, subsequent encounter: Secondary | ICD-10-CM

## 2015-09-02 MED ORDER — PREDNISONE 20 MG PO TABS: ORAL_TABLET | ORAL | 0 refills | Status: DC

## 2015-09-02 NOTE — ED Triage Notes (Signed)
Pt here for continued right foot pain since injury a week ago. sts that it was better until she returned to work and was on her feet all day. sts pain in the heel of her foot with slight swelling. Pt using crutches and ace wrap.

## 2015-09-02 NOTE — ED Provider Notes (Addendum)
[facility]    CSN: [id number] Arrival date & time: 09/02/15  1043  First Provider Contact:  None       History   Chief Complaint Chief Complaint  Patient presents with  . Foot Pain    HPI Jennifer Burnett is a 30 y.o. female.   HPI This a 30 year old woman who is seen in the urgent care on August 12 with a history of right foot pain following a fall on August 9.  She tried going back to work but the right foot became more painful as the day went on and swelled over the mid dorsal aspect of the MTP joints. Past Medical History:  Diagnosis Date  . PID (acute pelvic inflammatory disease)    as teenager  . Sickle cell trait (HCC)     There are no active problems to display for this patient.   Past Surgical History:  Procedure Laterality Date  . CESAREAN SECTION  10/31/2011   Procedure: CESAREAN SECTION;  Surgeon: Brock Badharles A Harper, MD;  Location: [facility];  Service: Obstetrics;  Laterality: N/A;  . NO PAST SURGERIES      OB History    Gravida Para Term Preterm AB Living   1 1 0 1 0 1   SAB TAB Ectopic Multiple Live Births   0 0 0 0 1       Home Medications    Prior to Admission medications   Medication Sig Start Date End Date Taking? Authorizing Provider  albuterol (PROVENTIL HFA;VENTOLIN HFA) 108 (90 BASE) MCG/ACT inhaler Inhale 1-2 puffs into the lungs every 6 (six) hours as needed for wheezing or shortness of breath (or persistent coughing). 12/03/13   Mathis FareJennifer Lee H Presson, PA  benzonatate (TESSALON) 100 MG capsule Take 1 capsule (100 mg total) by mouth 3 (three) times daily as needed for cough. 12/03/13   Mathis FareJennifer Lee H Presson, PA  ibuprofen (ADVIL,MOTRIN) 800 MG tablet Take 1 tablet (800 mg total) by mouth 3 (three) times daily. 08/25/15 08/30/15  Lucia EstelleFeng Zheng, NP  ipratropium (ATROVENT) 0.06 % nasal spray Place 2 sprays into both nostrils 4 (four) times daily. 12/03/13   Ria ClockJennifer Lee H Presson, PA  Iron-FA-B Cmp-C-Biot-Probiotic (FUSION PLUS) CAPS Take  1 capsule by mouth daily before breakfast. 11/03/11   Brock Badharles A Harper, MD  methylPREDNISolone (MEDROL DOSEPAK) 4 MG tablet follow package directions. Take with food 08/15/12   Hayden Rasmussenavid Mabe, NP  oxyCODONE-acetaminophen (PERCOCET/ROXICET) 5-325 MG per tablet Take 1-2 tablets by mouth every 4 (four) hours as needed (moderate - severe pain). 11/03/11   Brock Badharles A Harper, MD  predniSONE (DELTASONE) 20 MG tablet Two daily with food 09/02/15   Elvina SidleKurt Primo Innis, MD  Prenatal Vit-Fe Fumarate-FA (MULTIVITAMIN-PRENATAL) 27-0.8 MG TABS Take 1 tablet by mouth daily with breakfast.    Historical Provider, MD  traMADol (ULTRAM) 50 MG tablet Take 1 tablet (50 mg total) by mouth every 6 (six) hours as needed for pain. 08/15/12   Hayden Rasmussenavid Mabe, NP    Family History Family History  Problem Relation Age of Onset  . Anesthesia problems Neg Hx   . Hypotension Neg Hx   . Malignant hyperthermia Neg Hx   . Pseudochol deficiency Neg Hx   . Other Neg Hx     Social History Social History  Substance Use Topics  . Smoking status: Never Smoker  . Smokeless tobacco: Never Used  . Alcohol use No     Allergies   Review of patient's allergies indicates no known  allergies.   Review of Systems Review of Systems   Physical Exam Triage Vital Signs ED Triage Vitals  Enc Vitals Group     BP 09/02/15 1201 130/90     Pulse Rate 09/02/15 1201 85     Resp 09/02/15 1201 18     Temp 09/02/15 1201 98.6 F (37 C)     Temp src --      SpO2 09/02/15 1201 98 %     Weight --      Height --      Head Circumference --      Peak Flow --      Pain Score 09/02/15 1202 8     Pain Loc --      Pain Edu? --      Excl. in GC? --    No data found.   Updated Vital Signs BP 130/90   Pulse 85   Temp 98.6 F (37 C)   Resp 18   LMP 08/08/2015 (Exact Date)   SpO2 98%       Physical Exam No acute distress. Patient comes in on crutches and Ace wrap on her right foot. Examination of the right foot reveals no bony  abnormalities. There is some soft tissue swelling over the middle 3 metatarsal heads dorsally and the area is tender to palpation. She has extreme pain with passive hyperflexion of those toes. Progressive foot is nontender.  There is no ecchymosis and patient is able to wiggle her toes to some extent on her own.  UC Treatments / Results  Labs (all labs ordered are listed, but only abnormal results are displayed) Labs Reviewed - No data to display  EKG  EKG Interpretation None       Radiology No results found.  Procedures Procedures (including critical care time)  Medications Ordered in UC Medications - No data to display   Initial Impression / Assessment and Plan / UC Course  I have reviewed the triage vital signs and the nursing notes.  Pertinent labs & imaging results that were available during my care of the patient were reviewed by me and considered in my medical decision making (see chart for details).  Clinical Course      Final Clinical Impressions(s) / UC Diagnoses   Final diagnoses:  Foot sprain, right, subsequent encounter  Patient has had x-rays after her fall which were negative. She's continued to have pain, primarily after working a full shift. The swelling indicates an ongoing tendinitis and extensor tendons of her right foot.  New Prescriptions New Prescriptions   PREDNISONE (DELTASONE) 20 MG TABLET    Two daily with food  Return as necessary No work for the next 4 days.   Elvina SidleKurt Ramiah Helfrich, MD 09/02/15 1238    Elvina SidleKurt Birl Lobello, MD 09/02/15 1240

## 2015-09-02 NOTE — Discharge Instructions (Signed)
Please return if not improving in the next 3 days.

## 2016-06-24 ENCOUNTER — Encounter (HOSPITAL_COMMUNITY): Payer: Self-pay | Admitting: *Deleted

## 2016-06-24 ENCOUNTER — Emergency Department (HOSPITAL_COMMUNITY)
Admission: EM | Admit: 2016-06-24 | Discharge: 2016-06-24 | Disposition: A | Discharge status: Home / Self Care | Payer: Self-pay | Attending: Emergency Medicine | Admitting: Emergency Medicine

## 2016-06-24 DIAGNOSIS — T7840XA Allergy, unspecified, initial encounter: Secondary | ICD-10-CM

## 2016-06-24 DIAGNOSIS — L299 Pruritus, unspecified: Secondary | ICD-10-CM | POA: Insufficient documentation

## 2016-06-24 DIAGNOSIS — R21 Rash and other nonspecific skin eruption: Secondary | ICD-10-CM

## 2016-06-24 DIAGNOSIS — T781XXA Other adverse food reactions, not elsewhere classified, initial encounter: Secondary | ICD-10-CM | POA: Insufficient documentation

## 2016-06-24 DIAGNOSIS — Z79899 Other long term (current) drug therapy: Secondary | ICD-10-CM | POA: Insufficient documentation

## 2016-06-24 MED ORDER — PREDNISONE 10 MG PO TABS: 40.0000 mg | ORAL_TABLET | Freq: Every day | ORAL | 0 refills | Status: DC

## 2016-06-24 MED ORDER — PREDNISONE 10 MG PO TABS: 40.0000 mg | ORAL_TABLET | Freq: Every day | ORAL | 0 refills | Status: AC

## 2016-06-24 MED ORDER — METHYLPREDNISOLONE SODIUM SUCC 125 MG IJ SOLR
125.0000 mg | Freq: Once | INTRAMUSCULAR | Status: AC
  Administered 2016-06-24: 125 mg via INTRAMUSCULAR
  Filled 2016-06-24: qty 2

## 2016-06-24 NOTE — ED Notes (Signed)
Declined W/C at D/C and was escorted to lobby by RN. 

## 2016-06-24 NOTE — ED Triage Notes (Signed)
Pt reports eating seafood the past two nights and now has rash and itching to arms, neck and back. Denies any swelling or itching to airway. Denies any relief with benadryl. Airway intact.

## 2016-06-24 NOTE — ED Provider Notes (Signed)
[facility]EMER[facility]ENCY DEPT Provider Note   CSN: [id number] Arrival date & time: 06/24/16  1337  By signing my name below, I, Linna DarnerRussell Turner, attest that this documentation has been prepared under the direction and in the presence of Khanh Cordner, PA-C. Electronically Signed: Linna Darnerussell Turner, Scribe. 06/24/2016. 4:21 PM.  History   Chief Complaint Chief Complaint  Patient presents with  . Allergic Reaction   The history is provided by the patient. No language interpreter was used.    HPI Comments: Jennifer Burnett is a 31 y.o. female who presents to the Emergency Department complaining of a persistent, pruritic rash to her arms and neck beginning two days ago. She states she ate seafood two nights ago and developed pruritic, erythematous, raised areas to her arms and neck shortly thereafter. Patient states her rash worsened last night after eating seafood again. She has been scratching her arms and neck continually since onset of her rash. Patient has tried Benadryl and topical cream without improvement of her rash. She has no history of a similar reaction after eating seafood. She notes some recent rhinorrhea and congestion consistent with her h/o seasonal allergies and states these symptoms are unrelated to her rash; she has been taking Zyrtec on a regular basis for her seasonal allergies. No recent new soaps, lotions, or detergents. Patient denies dyspnea, dysphagia, trouble breathing, fevers, chills, or any other associated symptoms.  Past Medical History:  Diagnosis Date  . PID (acute pelvic inflammatory disease)    as teenager  . Sickle cell trait (HCC)     There are no active problems to display for this patient.   Past Surgical History:  Procedure Laterality Date  . CESAREAN SECTION  10/31/2011   Procedure: CESAREAN SECTION;  Surgeon: Brock Badharles A Harper, MD;  Location: [facility];  Service: Obstetrics;  Laterality: N/A;  . NO PAST SURGERIES      OB History    Gravida Para Term Preterm AB  Living   1 1 0 1 0 1   SAB TAB Ectopic Multiple Live Births   0 0 0 0 1       Home Medications    Prior to Admission medications   Medication Sig Start Date End Date Taking? Authorizing Provider  albuterol (PROVENTIL HFA;VENTOLIN HFA) 108 (90 BASE) MCG/ACT inhaler Inhale 1-2 puffs into the lungs every 6 (six) hours as needed for wheezing or shortness of breath (or persistent coughing). 12/03/13   Presson, Mathis FareJennifer Lee H, PA  benzonatate (TESSALON) 100 MG capsule Take 1 capsule (100 mg total) by mouth 3 (three) times daily as needed for cough. 12/03/13   Presson, Mathis FareJennifer Lee H, PA  ibuprofen (ADVIL,MOTRIN) 800 MG tablet Take 1 tablet (800 mg total) by mouth 3 (three) times daily. 08/25/15 08/30/15  Lucia EstelleZheng, Feng, NP  ipratropium (ATROVENT) 0.06 % nasal spray Place 2 sprays into both nostrils 4 (four) times daily. 12/03/13   Presson, Mathis FareJennifer Lee H, PA  Iron-FA-B Cmp-C-Biot-Probiotic (FUSION PLUS) CAPS Take 1 capsule by mouth daily before breakfast. 11/03/11   Brock BadHarper, Charles A, MD  methylPREDNISolone (MEDROL DOSEPAK) 4 MG tablet follow package directions. Take with food 08/15/12   Hayden RasmussenMabe, David, NP  oxyCODONE-acetaminophen (PERCOCET/ROXICET) 5-325 MG per tablet Take 1-2 tablets by mouth every 4 (four) hours as needed (moderate - severe pain). 11/03/11   Brock BadHarper, Charles A, MD  predniSONE (DELTASONE) 10 MG tablet Take 4 tablets (40 mg total) by mouth daily. 06/24/16 06/28/16  Jennet Scroggin, Hillary BowHina, PA-C  Prenatal Vit-Fe Fumarate-FA (MULTIVITAMIN-PRENATAL) 27-0.8 MG TABS  Take 1 tablet by mouth daily with breakfast.    [provider]  traMADol (ULTRAM) 50 MG tablet Take 1 tablet (50 mg total) by mouth every 6 (six) hours as needed for pain. 08/15/12   Hayden Rasmussen, NP    Family History Family History  Problem Relation Age of Onset  . Anesthesia problems Neg Hx   . Hypotension Neg Hx   . Malignant hyperthermia Neg Hx   . Pseudochol deficiency Neg Hx   . Other Neg Hx     Social History Social  History  Substance Use Topics  . Smoking status: Never Smoker  . Smokeless tobacco: Never Used  . Alcohol use No     Allergies   Patient has no known allergies.   Review of Systems Review of Systems  Constitutional: Negative for chills and fever.  HENT: Positive for congestion and rhinorrhea. Negative for trouble swallowing.   Respiratory: Negative for shortness of breath.   Skin: Positive for rash.   Physical Exam Updated Vital Signs BP (!) 137/91 (BP Location: Left Arm)   Pulse 90   Temp 98.6 F (37 C) (Oral)   Resp 17   Ht 5\' 4"  (1.626 m)   Wt 59 kg (130 lb)   LMP 06/20/2016   SpO2 100%   BMI 22.31 kg/m   Physical Exam  Constitutional: She appears well-developed and well-nourished. No distress.  HENT:  Head: Normocephalic and atraumatic.  Eyes: Conjunctivae and EOM are normal. No scleral icterus.  Neck: Normal range of motion.  Cardiovascular: Normal rate and regular rhythm.   Pulmonary/Chest: Effort normal and breath sounds normal. No respiratory distress.  Airway appears intact. No respiratory distress and she is tolerating secretions.  Neurological: She is alert.  Skin: Rash noted. She is not diaphoretic.  Diffuse pruritic, slightly raised, hyperpigmented rash on bilateral upper extremities and front of neck. No visible hives. There are excoriation marks on arms but no bleeding noted.  Psychiatric: She has a normal mood and affect.  Nursing note and vitals reviewed.  ED Treatments / Results  Labs (all labs ordered are listed, but only abnormal results are displayed) Labs Reviewed - No data to display  EKG  EKG Interpretation None       Radiology No results found.  Procedures Procedures (including critical care time)  DIAGNOSTIC STUDIES: Oxygen Saturation is 100% on RA, normal by my interpretation.    COORDINATION OF CARE: 4:19 PM Discussed treatment plan with pt at bedside and pt agreed to plan.  Medications Ordered in ED Medications    methylPREDNISolone sodium succinate (SOLU-MEDROL) 125 mg/2 mL injection 125 mg (125 mg Intramuscular Given 06/24/16 1649)     Initial Impression / Assessment and Plan / ED Course  I have reviewed the triage vital signs and the nursing notes.  Pertinent labs & imaging results that were available during my care of the patient were reviewed by me and considered in my medical decision making (see chart for details).    Patient's history and symptoms concerning for dermatitis caused by an allergic reaction. There is a diffusely pruritic rash but no evidence of urticaria. She has no evidence of angioedema or anaphylaxis present. She is not respiratory distress and is tolerating secretions, with no lip swelling presents. She denies prior history of anaphylactic reaction. Symptoms could be due to exposure to seafood although she has no history of previous reaction to this. Will give Solu-Medrol here in the ED and will discharge with prednisone burst to be taken  starting tomorrow. Advised her to continue her usual artery medication at home. Patient appears stable for discharge at this time. Advised to follow-up with PCP for any further allergy testing referrals that as needed. Strict return precautions given.  Final Clinical Impressions(s) / ED Diagnoses   Final diagnoses:  Rash  Allergic reaction, initial encounter    New Prescriptions Discharge Medication List as of 06/24/2016  4:27 PM     I personally performed the services described in this documentation, which was scribed in my presence. The recorded information has been reviewed and is accurate.    Dietrich Pates, PA-C 06/24/16 1709    Nira Conn, MD 06/25/16 0000

## 2016-06-24 NOTE — Discharge Instructions (Signed)
Take prednisone 40mg  starting tomorrow for 4 days. Follow up with PCP for further evaluation and referral for allergy testing if needed. Return to ED for worsening pain, trouble breathing, trouble swallowing.

## 2016-09-21 ENCOUNTER — Emergency Department (HOSPITAL_COMMUNITY)
Admission: EM | Admit: 2016-09-21 | Discharge: 2016-09-21 | Disposition: A | Discharge status: Home / Self Care | Payer: Self-pay | Attending: Emergency Medicine | Admitting: Emergency Medicine

## 2016-09-21 ENCOUNTER — Emergency Department (HOSPITAL_COMMUNITY): Payer: Self-pay

## 2016-09-21 ENCOUNTER — Encounter (HOSPITAL_COMMUNITY): Payer: Self-pay | Admitting: *Deleted

## 2016-09-21 DIAGNOSIS — D573 Sickle-cell trait: Secondary | ICD-10-CM | POA: Insufficient documentation

## 2016-09-21 DIAGNOSIS — R0789 Other chest pain: Secondary | ICD-10-CM | POA: Insufficient documentation

## 2016-09-21 LAB — CBC
HCT: 32.3 % — ABNORMAL LOW (ref 36.0–46.0)
Hemoglobin: 10 g/dL — ABNORMAL LOW (ref 12.0–15.0)
MCH: 20.7 pg — ABNORMAL LOW (ref 26.0–34.0)
MCHC: 31 g/dL (ref 30.0–36.0)
MCV: 67 fL — ABNORMAL LOW (ref 78.0–100.0)
PLATELETS: 251 10*3/uL (ref 150–400)
RBC: 4.82 MIL/uL (ref 3.87–5.11)
RDW: 20.1 % — AB (ref 11.5–15.5)
WBC: 9.6 10*3/uL (ref 4.0–10.5)

## 2016-09-21 LAB — HCG, QUANTITATIVE, PREGNANCY: hCG, Beta Chain, Quant, S: <1 m[IU]/mL (ref <5)

## 2016-09-21 LAB — BASIC METABOLIC PANEL
ANION GAP: 8 (ref 5–15)
BUN: 8 mg/dL (ref 6–20)
CALCIUM: 8.9 mg/dL (ref 8.9–10.3)
CO2: 21 mmol/L — ABNORMAL LOW (ref 22–32)
Chloride: 108 mmol/L (ref 101–111)
Creatinine, Ser: 0.79 mg/dL (ref 0.44–1.00)
GFR calc Af Amer: >60 mL/min (ref >60)
GLUCOSE: 92 mg/dL (ref 65–99)
Potassium: 3.5 mmol/L (ref 3.5–5.1)
SODIUM: 137 mmol/L (ref 135–145)

## 2016-09-21 LAB — I-STAT TROPONIN, ED: TROPONIN I, POC: 0 ng/mL (ref 0.00–0.08)

## 2016-09-21 LAB — D-DIMER, QUANTITATIVE: D-Dimer, Quant: 0.64 ug/mL-FEU — ABNORMAL HIGH (ref 0.00–0.50)

## 2016-09-21 MED ORDER — IOPAMIDOL (ISOVUE-370) INJECTION 76%
INTRAVENOUS | Status: AC
  Administered 2016-09-21: 100 mL
  Filled 2016-09-21: qty 100

## 2016-09-21 MED ORDER — FUSION PLUS PO CAPS: 1.0000 | ORAL_CAPSULE | Freq: Every day | ORAL | 0 refills | Status: AC

## 2016-09-21 NOTE — ED Notes (Signed)
Pt c/o mid sternal chest pressure x's 2 days.  St's also pain under left arm.  Pain worse with movement or cough

## 2016-09-21 NOTE — ED Notes (Signed)
ED Provider at bedside. 

## 2016-09-21 NOTE — ED Provider Notes (Signed)
[facility] Provider Note   CSN: [id number] Arrival date & time: 09/21/16  1539     History   Chief Complaint Chief Complaint  Patient presents with  . Chest Pain    HPI Jennifer Burnett is a 31 y.o. female.  HPI   31 year old female with history of sickle cell trait presenting today for evaluation of chest pain. Patient reports she developed acute onset of pain to her left side of chest and left shoulder since yesterday. Describe pain as a pressure or heavy sensation on her left chest radiates to her left shoulder with associated shortness of breath. States it hurts when she breathes, and hurts when she coughs. It also hurts when she moves her left shoulder and arm. Does endorse some lightheadedness yesterday. At rest, her pain is minimal. She denies any associated fever, chills, nausea vomiting diarrhea diaphoresis, abdominal pain, back pain, or rash. She denies any strenuous activities or heavy lifting. No prior history of PE or DVT, no recent surgery, prolonged bed rest, unilateral leg swelling or calf pain, active cancer, hemoptysis. She is not on any birth control pill. She denies any strong family history of cardiac disease, no history of premature cardiac death.   Past Medical History:  Diagnosis Date  . PID (acute pelvic inflammatory disease)    as teenager  . Sickle cell trait (HCC)     There are no active problems to display for this patient.   Past Surgical History:  Procedure Laterality Date  . CESAREAN SECTION  10/31/2011   Procedure: CESAREAN SECTION;  Surgeon: Brock Bad, MD;  Location: [facility];  Service: Obstetrics;  Laterality: N/A;  . NO PAST SURGERIES      OB History    Gravida Para Term Preterm AB Living   1 1 0 1 0 1   SAB TAB Ectopic Multiple Live Births   0 0 0 0 1       Home Medications    Prior to Admission medications   Medication Sig Start Date End Date Taking? Authorizing Provider  albuterol (PROVENTIL HFA;VENTOLIN HFA) 108 (90  BASE) MCG/ACT inhaler Inhale 1-2 puffs into the lungs every 6 (six) hours as needed for wheezing or shortness of breath (or persistent coughing). 12/03/13   Presson, Mathis Fare, PA  benzonatate (TESSALON) 100 MG capsule Take 1 capsule (100 mg total) by mouth 3 (three) times daily as needed for cough. 12/03/13   Presson, Mathis Fare, PA  ibuprofen (ADVIL,MOTRIN) 800 MG tablet Take 1 tablet (800 mg total) by mouth 3 (three) times daily. 08/25/15 08/30/15  Lucia Estelle, NP  ipratropium (ATROVENT) 0.06 % nasal spray Place 2 sprays into both nostrils 4 (four) times daily. 12/03/13   Presson, Mathis Fare, PA  Iron-FA-B Cmp-C-Biot-Probiotic (FUSION PLUS) CAPS Take 1 capsule by mouth daily before breakfast. 11/03/11   Brock Bad, MD  methylPREDNISolone (MEDROL DOSEPAK) 4 MG tablet follow package directions. Take with food 08/15/12   Hayden Rasmussen, NP  oxyCODONE-acetaminophen (PERCOCET/ROXICET) 5-325 MG per tablet Take 1-2 tablets by mouth every 4 (four) hours as needed (moderate - severe pain). 11/03/11   Brock Bad, MD  Prenatal Vit-Fe Fumarate-FA (MULTIVITAMIN-PRENATAL) 27-0.8 MG TABS Take 1 tablet by mouth daily with breakfast.    [provider]  traMADol (ULTRAM) 50 MG tablet Take 1 tablet (50 mg total) by mouth every 6 (six) hours as needed for pain. 08/15/12   Hayden Rasmussen, NP    Family History Family History  Problem Relation Age of Onset  . Anesthesia problems Neg Hx   . Hypotension Neg Hx   . Malignant hyperthermia Neg Hx   . Pseudochol deficiency Neg Hx   . Other Neg Hx     Social History Social History  Substance Use Topics  . Smoking status: Never Smoker  . Smokeless tobacco: Never Used  . Alcohol use No     Allergies   Patient has no known allergies.   Review of Systems Review of Systems  All other systems reviewed and are negative.    Physical Exam Updated Vital Signs BP (!) 141/91 (BP Location: Right Arm)   Pulse (!) 108   Temp 98 F (36.7  C) (Oral)   Resp 18   LMP 08/23/2016   SpO2 98%   Physical Exam  Constitutional: She appears well-developed and well-nourished. No distress.  HENT:  Head: Atraumatic.  Eyes: Conjunctivae are normal.  Neck: Neck supple.  Cardiovascular: Normal rate, regular rhythm and intact distal pulses.   Pulmonary/Chest: Effort normal and breath sounds normal. She exhibits tenderness (Tenderness to left anterior chest wall on palpation and tenderness to left shoulder with normal shoulder range of motion.).  Abdominal: Soft. Bowel sounds are normal. She exhibits no distension. There is no tenderness.  Musculoskeletal: She exhibits no edema.  Neurological: She is alert.  Skin: No rash noted.  Psychiatric: She has a normal mood and affect.  Nursing note and vitals reviewed.    ED Treatments / Results  Labs (all labs ordered are listed, but only abnormal results are displayed) Labs Reviewed  BASIC METABOLIC PANEL - Abnormal; Notable for the following:       Result Value   CO2 21 (*)    All other components within normal limits  CBC - Abnormal; Notable for the following:    Hemoglobin 10.0 (*)    HCT 32.3 (*)    MCV 67.0 (*)    MCH 20.7 (*)    RDW 20.1 (*)    All other components within normal limits  D-DIMER, QUANTITATIVE (NOT AT [facility]) - Abnormal; Notable for the following:    D-Dimer, Quant 0.64 (*)    All other components within normal limits  HCG, QUANTITATIVE, PREGNANCY  I-STAT TROPONIN, ED    EKG  EKG Interpretation  Date/Time:  Thursday September 21 2016 15:39:56 EDT Ventricular Rate:  108 PR Interval:  158 QRS Duration: 70 QT Interval:  344 QTC Calculation: 460 R Axis:   73 Text Interpretation:  Sinus tachycardia Otherwise normal ECG No prior ECG for comparison.  No STEMI Confirmed by Theda Belfastegeler, Chris ([id number]) on 09/21/2016 8:31:44 PM      Date: 09/21/2016  Rate: 108  Rhythm: sinus tachycardia  QRS Axis: normal  Intervals: normal  ST/T Wave abnormalities: normal   Conduction Disutrbances: none  Narrative Interpretation:   Old EKG Reviewed: No significant changes noted     Radiology Dg Chest 2 View  Result Date: 09/21/2016 CLINICAL DATA:  Left-sided chest pain for 2 days. EXAM: CHEST  2 VIEW COMPARISON:  None. FINDINGS: The heart size and mediastinal contours are within normal limits. Both lungs are clear. The visualized skeletal structures are unremarkable. IMPRESSION: No active cardiopulmonary disease. Electronically Signed   By: Gerome Samavid  Williams III M.D   On: 09/21/2016 16:20   Ct Angio Chest Pe W And/or Wo Contrast  Result Date: 09/21/2016 CLINICAL DATA:  Pt reports chest pain, severe on left side and left arm weakness onset yesterday. PE suspected. EXAM: CT ANGIOGRAPHY  CHEST WITH CONTRAST TECHNIQUE: Multidetector CT imaging of the chest was performed using the standard protocol during bolus administration of intravenous contrast. Multiplanar CT image reconstructions and MIPs were obtained to evaluate the vascular anatomy. CONTRAST:  60 ml isovue 370 iv COMPARISON:  None. FINDINGS: Cardiovascular: Satisfactory opacification of the pulmonary arteries to the segmental level. No evidence of pulmonary embolism. Normal heart size. No pericardial effusion. Mediastinum/Nodes: No enlarged mediastinal, hilar, or axillary lymph nodes. Thyroid gland, trachea, and esophagus demonstrate no significant findings. Lungs/Pleura: Lungs are clear. No pleural effusion or pneumothorax. Upper Abdomen: No acute abnormality. Musculoskeletal: No chest wall abnormality. No acute or significant osseous findings. Review of the MIP images confirms the above findings. IMPRESSION: No evidence of significant pulmonary embolus. No evidence of active pulmonary disease. Electronically Signed   By: Burman Nieves M.D.   On: 09/21/2016 21:13    Procedures Procedures (including critical care time)  Medications Ordered in ED Medications  iopamidol (ISOVUE-370) 76 % injection (100 mLs   Contrast Given 09/21/16 2047)     Initial Impression / Assessment and Plan / ED Course  I have reviewed the triage vital signs and the nursing notes.  Pertinent labs & imaging results that were available during my care of the patient were reviewed by me and considered in my medical decision making (see chart for details).     BP (!) 138/102   Pulse 81   Temp 98 F (36.7 C) (Oral)   Resp 10   LMP 08/23/2016   SpO2 100%    Final Clinical Impressions(s) / ED Diagnoses   Final diagnoses:  Atypical chest pain    New Prescriptions Discharge Medication List as of 09/21/2016  9:55 PM     6:03 PM Patient with left-sided chest pain is reproducible on exam. Likely musculoskeletal. He does not have any significant cardiac history. The heart score is 1. She however is tachycardic and does complain of pleuritic chest pain and shortness of breath therefore a d-dimer ordered.  7:16 PM D-dimer is mildly elevated at 0.64.  Will obtain chest cta to r/o PE.    9:49 PM Chest CTA negative for acute pathology.  Pt is reassured.  Recommend outpt f/u for further care.  Otherwise pt stable for discharge.    Fayrene Helper, PA-C 09/22/16 0037    Tegeler, Canary Brim, MD 09/22/16 [phone]

## 2016-09-21 NOTE — ED Notes (Signed)
Patient transported to CT 

## 2016-09-21 NOTE — Discharge Instructions (Signed)
You have been evaluated for your chest discomfort.  You testing today revealed no concerning findings.  You are mildly anemic. Take iron supplementation and follow up with your doctor for further care.

## 2016-09-21 NOTE — ED Triage Notes (Signed)
Pt states that she has been having left chest and arm pain for 2 days. Reports pressure and SOB. States that it is worsened with movement.

## 2016-10-12 ENCOUNTER — Encounter (HOSPITAL_COMMUNITY): Payer: Self-pay

## 2016-10-12 DIAGNOSIS — Z043 Encounter for examination and observation following other accident: Secondary | ICD-10-CM | POA: Insufficient documentation

## 2016-10-12 DIAGNOSIS — Z5321 Procedure and treatment not carried out due to patient leaving prior to being seen by health care provider: Secondary | ICD-10-CM | POA: Insufficient documentation

## 2016-10-12 NOTE — ED Triage Notes (Signed)
Pt presents to the ed for a follow up from being here 3 weeks ago, states her paperwork stated to come back here to get cleared for work. Pt has no complaints

## 2017-05-11 ENCOUNTER — Emergency Department (HOSPITAL_COMMUNITY)
Admission: EM | Admit: 2017-05-11 | Discharge: 2017-05-11 | Disposition: A | Discharge status: Home / Self Care | Payer: Self-pay | Attending: Emergency Medicine | Admitting: Emergency Medicine

## 2017-05-11 ENCOUNTER — Encounter (HOSPITAL_COMMUNITY): Payer: Self-pay

## 2017-05-11 DIAGNOSIS — Z79899 Other long term (current) drug therapy: Secondary | ICD-10-CM | POA: Insufficient documentation

## 2017-05-11 DIAGNOSIS — T7840XA Allergy, unspecified, initial encounter: Secondary | ICD-10-CM | POA: Insufficient documentation

## 2017-05-11 DIAGNOSIS — L509 Urticaria, unspecified: Secondary | ICD-10-CM

## 2017-05-11 MED ORDER — PREDNISONE 20 MG PO TABS: 40.0000 mg | ORAL_TABLET | Freq: Every day | ORAL | 0 refills | Status: DC

## 2017-05-11 MED ORDER — PREDNISONE 20 MG PO TABS
60.0000 mg | ORAL_TABLET | Freq: Once | ORAL | Status: AC
  Administered 2017-05-11: 60 mg via ORAL
  Filled 2017-05-11: qty 3

## 2017-05-11 MED ORDER — DIPHENHYDRAMINE HCL 25 MG PO CAPS
25.0000 mg | ORAL_CAPSULE | Freq: Once | ORAL | Status: AC
  Administered 2017-05-11: 25 mg via ORAL
  Filled 2017-05-11: qty 1

## 2017-05-11 MED ORDER — DIPHENHYDRAMINE HCL 25 MG PO CAPS: 50.0000 mg | ORAL_CAPSULE | Freq: Once | ORAL | Status: DC

## 2017-05-11 MED ORDER — FAMOTIDINE 40 MG PO TABS: 40.0000 mg | ORAL_TABLET | Freq: Every day | ORAL | 0 refills | Status: AC

## 2017-05-11 MED ORDER — FAMOTIDINE 20 MG PO TABS
40.0000 mg | ORAL_TABLET | Freq: Once | ORAL | Status: AC
  Administered 2017-05-11: 40 mg via ORAL
  Filled 2017-05-11: qty 2

## 2017-05-11 NOTE — Discharge Instructions (Addendum)
You were seen here for an allergic reaction.  You were treated with steroids, benadryl and famotidine in the department.  Please take famotidine and steroids over the next 5 days. Continue to use 25mg  of benadryl every 4-6 hours as needed for itching.  Please return if you have any symptoms of throat closing, difficulty swallowing/breathing, swelling of the lips/face/tongue.  Follow up with your pcp this week.  Avoid seafood as this may be your trigger. Please consider seeing an allergist for testing.  If you develop worsening or new concerning symptoms you can return to the emergency department for re-evaluation.

## 2017-05-11 NOTE — ED Triage Notes (Signed)
Patient complains of recurrent itchy raised rash to body x 2 days, seen previously for same, NAD

## 2017-05-11 NOTE — ED Provider Notes (Signed)
[facility] EMERGENCY DEPARTMENT Provider Note   CSN: [id number] Arrival date & time: 05/11/17  0846     History   Chief Complaint Chief Complaint  Patient presents with  . Rash    HPI Jennifer Burnett is a 32 y.o. female presents the emergency department today complaining of persisting, pruritic rash to her arms, neck as well as face that began 2 days ago.  Patient states that she ate shrimp and lobster tail 2 nights ago and shortly after started developing a pruritic, erythematous, raised rash on her arms and neck.  She notes that after she had one small area on her left cheek that was similar.  She notes that the areas have come and gone.  She has been trying to avoid scratching the areas she does not want to make them worse.  She has been taking Benadryl for her symptoms with mild improvement.  She notes history of similar reaction when eating seafood and was seen here in June 2018 for the same.  The patient denies any facial swelling, lip swelling, difficulty swallowing, inability to control secretions, difficulty breathing, shortness of breath, chest tightness, abdominal pain/cramping, nausea/vomiting/diarrhea.  HPI  Past Medical History:  Diagnosis Date  . PID (acute pelvic inflammatory disease)    as teenager  . Sickle cell trait (HCC)     There are no active problems to display for this patient.   Past Surgical History:  Procedure Laterality Date  . CESAREAN SECTION  10/31/2011   Procedure: CESAREAN SECTION;  Surgeon: Brock Bad, MD;  Location: [facility];  Service: Obstetrics;  Laterality: N/A;  . NO PAST SURGERIES       OB History    Gravida  1   Para  1   Term  0   Preterm  1   AB  0   Living  1     SAB  0   TAB  0   Ectopic  0   Multiple  0   Live Births  1            Home Medications    Prior to Admission medications   Medication Sig Start Date End Date Taking? Authorizing Provider  cetirizine (ZYRTEC) 10 MG tablet  Take 10 mg by mouth daily.    [provider]  Iron-FA-B Cmp-C-Biot-Probiotic (FUSION PLUS) CAPS Take 1 capsule by mouth daily before breakfast. 09/21/16   Fayrene Helper, PA-C    Family History Family History  Problem Relation Age of Onset  . Anesthesia problems Neg Hx   . Hypotension Neg Hx   . Malignant hyperthermia Neg Hx   . Pseudochol deficiency Neg Hx   . Other Neg Hx     Social History Social History   Tobacco Use  . Smoking status: Never Smoker  . Smokeless tobacco: Never Used  Substance Use Topics  . Alcohol use: No  . Drug use: No     Allergies   Patient has no known allergies.   Review of Systems Review of Systems  All other systems reviewed and are negative.    Physical Exam Updated Vital Signs BP (!) 158/100   Pulse 86   Temp 98.1 F (36.7 C) (Oral)   Resp 18   SpO2 100%   Physical Exam  Constitutional: She appears well-developed and well-nourished.  HENT:  Head: Normocephalic and atraumatic.  Right Ear: External ear normal.  Left Ear: External ear normal.  Nose: Nose normal.  Mouth/Throat: Uvula  is midline, oropharynx is clear and moist and mucous membranes are normal. No tonsillar exudate.  No angioedema. No facial swelling. Patient with normal phonation and in control of her secretions. No difficulty swallowing. No trismus. MMM.   Eyes: Pupils are equal, round, and reactive to light. Right eye exhibits no discharge. Left eye exhibits no discharge. No scleral icterus.  Neck: Trachea normal. Neck supple. No spinous process tenderness present. No neck rigidity. Normal range of motion present.  Cardiovascular: Normal rate, regular rhythm and intact distal pulses.  No murmur heard. Pulses:      Radial pulses are 2+ on the right side, and 2+ on the left side.       Dorsalis pedis pulses are 2+ on the right side, and 2+ on the left side.       Posterior tibial pulses are 2+ on the right side, and 2+ on the left side.  No lower extremity  swelling or edema. Calves symmetric in size bilaterally.  Pulmonary/Chest: Effort normal and breath sounds normal. She exhibits no tenderness.  No increased work of breathing. No accessory muscle use. Patient is sitting upright, speaking in full sentences without difficulty   Abdominal: Soft. Bowel sounds are normal. There is no tenderness. There is no rebound and no guarding.  Musculoskeletal: She exhibits no edema.  Lymphadenopathy:    She has no cervical adenopathy.  Neurological: She is alert.  Skin: Skin is warm and dry. Rash noted. Rash is urticarial. She is not diaphoretic.  Diffuse, urticarial-like rash on bilateral upper extremities, posterior right neck as well as 1 small urticarial left cheek that measures 1 cm x 1 cm.  No overlying excoriations.  No overlying superimposed infection.  No bleeding. No blisters, no pustules, no warmth, no draining sinus tracts, no superficial abscesses, no bullous impetigo, no vesicles, no desquamation, no target lesions with dusky purpura or a central bulla. Not tender to touch.  Psychiatric: She has a normal mood and affect.  Nursing note and vitals reviewed.    ED Treatments / Results  Labs (all labs ordered are listed, but only abnormal results are displayed) Labs Reviewed - No data to display  EKG None  Radiology No results found.  Procedures Procedures (including critical care time)  Medications Ordered in ED Medications  predniSONE (DELTASONE) tablet 60 mg (60 mg Oral Given 05/11/17 1144)  famotidine (PEPCID) tablet 40 mg (40 mg Oral Given 05/11/17 1144)  diphenhydrAMINE (BENADRYL) capsule 25 mg (25 mg Oral Given 05/11/17 1144)     Initial Impression / Assessment and Plan / ED Course  I have reviewed the triage vital signs and the nursing notes.  Pertinent labs & imaging results that were available during my care of the patient were reviewed by me and considered in my medical decision making (see chart for details).      32 year old female that presents the emergency room today for urticarial-like rash that began after eating seafood.  Patient has been taking Benadryl for symptoms with mild relief.  He denies any lip swelling, there will be swelling, shortness of breath, facial swelling, inability to control secretions, chest tightness, abdominal cramping, nausea/vomiting/diarrhea.  Patient is exam without evidence of anaphylaxis.  Patient treated in the department with prednisone, codeine and Benadryl.  She reports improvement of her symptoms. Patient re-evaluated prior to dc, is hemodynamically stable, in no respiratory distress, and denies the feeling of throat closing. Pt has been advised to take OTC benadryl & return to the ED if they have  a mod-severe allergic rxn (s/s including throat closing, difficulty breathing, swelling of lips face or tongue).  She will be sent home with steroid burst and told to take famotidine over the next 5 days.  She was told to avoid seafood as this is her second presentation for the same.  Advised that she may need to follow-up with an allergist.  Pt is to follow up with their PCP. Pt is agreeable with plan & verbalizes understanding.  Final Clinical Impressions(s) / ED Diagnoses   Final diagnoses:  Urticaria  Allergic reaction, initial encounter    ED Discharge Orders        Ordered    famotidine (PEPCID) 40 MG tablet  Daily     05/11/17 1209    predniSONE (DELTASONE) 20 MG tablet  Daily with breakfast     05/11/17 1209       Princella PellegriniMaczis, Inola Lisle M, PA-C 05/11/17 1209    Vanetta MuldersZackowski, Scott, MD 05/13/17 2030

## 2017-08-25 ENCOUNTER — Encounter (HOSPITAL_COMMUNITY): Payer: Self-pay | Admitting: Emergency Medicine

## 2017-08-25 ENCOUNTER — Emergency Department (HOSPITAL_COMMUNITY)
Admission: EM | Admit: 2017-08-25 | Discharge: 2017-08-25 | Disposition: A | Discharge status: Home / Self Care | Payer: Self-pay | Attending: Emergency Medicine | Admitting: Emergency Medicine

## 2017-08-25 ENCOUNTER — Other Ambulatory Visit: Payer: Self-pay

## 2017-08-25 DIAGNOSIS — D573 Sickle-cell trait: Secondary | ICD-10-CM | POA: Insufficient documentation

## 2017-08-25 DIAGNOSIS — L03114 Cellulitis of left upper limb: Secondary | ICD-10-CM | POA: Insufficient documentation

## 2017-08-25 DIAGNOSIS — L02414 Cutaneous abscess of left upper limb: Secondary | ICD-10-CM | POA: Insufficient documentation

## 2017-08-25 DIAGNOSIS — Z79899 Other long term (current) drug therapy: Secondary | ICD-10-CM | POA: Insufficient documentation

## 2017-08-25 DIAGNOSIS — L02419 Cutaneous abscess of limb, unspecified: Secondary | ICD-10-CM

## 2017-08-25 DIAGNOSIS — L03119 Cellulitis of unspecified part of limb: Secondary | ICD-10-CM

## 2017-08-25 MED ORDER — LIDOCAINE HCL (PF) 1 % IJ SOLN
5.0000 mL | Freq: Once | INTRAMUSCULAR | Status: AC
  Administered 2017-08-25: 5 mL
  Filled 2017-08-25: qty 5

## 2017-08-25 MED ORDER — DOXYCYCLINE HYCLATE 100 MG PO CAPS: 100.0000 mg | ORAL_CAPSULE | Freq: Two times a day (BID) | ORAL | 0 refills | Status: DC

## 2017-08-25 NOTE — Discharge Instructions (Signed)
You were seen in the emergency department today for an abscess with cellulitis to your left forearm.  We incised and drained this area.  We would like you to apply warm compresses 4-6 times per day.  We are placing on doxycycline, and antibiotic, to help treat the infection. Please take all of your antibiotics until finished. You may develop abdominal discomfort or diarrhea from the antibiotic.  You may help offset this with probiotics which you can buy at the store (ask your pharmacist if unable to find) or get probiotics in the form of eating yogurt. Do not eat or take the probiotics until 2 hours after your antibiotic. If you are unable to tolerate these side effects follow-up with your primary care provider or return to the emergency department.   If you begin to experience any blistering, rashes, swelling, or difficulty breathing seek medical care for evaluation of potentially more serious side effects.   Please be aware that this medication may interact with other medications you are taking, please be sure to discuss your medication list with your pharmacist. If you are taking birth control the antibiotic will deactivate your birth control for 2 weeks.   Please take Tylenol and or Motrin per over-the-counter dosing instructions for any continued discomfort in this area.  Would like you to follow-up within 48 hours for recheck of the area, you may return to the ER, go to an urgent care, or see your primary care provider.  Return to the ER sooner for new or worsening symptoms or any other concerns.  Additionally have your blood pressure rechecked within 1 week by primary care provider as it was elevated in the emergency department today.  If you do not have a primary care provider we have given you information for our [place] community clinic.

## 2017-08-25 NOTE — ED Provider Notes (Signed)
[facility] EMERGENCY DEPARTMENT Provider Note   CSN: [id number] Arrival date & time: 08/25/17  4696     History   Chief Complaint Chief Complaint  Patient presents with  . Insect Bite  . Abscess    HPI Jennifer Burnett is a 32 y.o. female with a hx of PID and sickle cell trait who presents to the ED with complaints of insect bite to the L forearm that has been waxing/waning for 2 weeks. Patient states that she did not actually witness or feel a specific insect bite, however she did wake up one morning with a swollen area to the L forearm. The area has been waxing/waning with pain/swelling. No specific alleviating/aggravating factors. She states that when it appears most swollen it looks as if there is pus under the skin that needs to come out, she has not had any active drainage. Has not tried meds or intervention PTA. Denies fever, chills, nausea, vomiting, numbness, or weakness. No known tic removal. No IVDU. Denies chance of pregnancy.   HPI  Past Medical History:  Diagnosis Date  . PID (acute pelvic inflammatory disease)    as teenager  . Sickle cell trait (HCC)     There are no active problems to display for this patient.   Past Surgical History:  Procedure Laterality Date  . CESAREAN SECTION  10/31/2011   Procedure: CESAREAN SECTION;  Surgeon: Brock Bad, MD;  Location: [facility];  Service: Obstetrics;  Laterality: N/A;  . NO PAST SURGERIES       OB History    Gravida  1   Para  1   Term  0   Preterm  1   AB  0   Living  1     SAB  0   TAB  0   Ectopic  0   Multiple  0   Live Births  1            Home Medications    Prior to Admission medications   Medication Sig Start Date End Date Taking? Authorizing Provider  cetirizine (ZYRTEC) 10 MG tablet Take 10 mg by mouth daily.    [provider]  famotidine (PEPCID) 40 MG tablet Take 1 tablet (40 mg total) by mouth daily. 05/11/17   Maczis, Elmer Sow, PA-C    Iron-FA-B Cmp-C-Biot-Probiotic (FUSION PLUS) CAPS Take 1 capsule by mouth daily before breakfast. 09/21/16   Fayrene Helper, PA-C  predniSONE (DELTASONE) 20 MG tablet Take 2 tablets (40 mg total) by mouth daily with breakfast. 05/11/17   Maczis, Elmer Sow, PA-C    Family History Family History  Problem Relation Age of Onset  . Anesthesia problems Neg Hx   . Hypotension Neg Hx   . Malignant hyperthermia Neg Hx   . Pseudochol deficiency Neg Hx   . Other Neg Hx     Social History Social History   Tobacco Use  . Smoking status: Never Smoker  . Smokeless tobacco: Never Used  Substance Use Topics  . Alcohol use: No  . Drug use: No     Allergies   Patient has no known allergies.   Review of Systems Review of Systems  Constitutional: Negative for chills and fever.  Gastrointestinal: Negative for nausea and vomiting.  Skin: Positive for wound.  Neurological: Negative for weakness and numbness.     Physical Exam Updated Vital Signs BP (!) 151/100   Pulse 98   Temp 98.3 F (36.8 C)   Resp  14   LMP 08/09/2017 (Exact Date)   SpO2 100%   Physical Exam  Constitutional: She appears well-developed and well-nourished. No distress.  HENT:  Head: Normocephalic and atraumatic.  Eyes: Conjunctivae are normal. Right eye exhibits no discharge. Left eye exhibits no discharge.  Cardiovascular:  2+ symmetric radial/ulnar pulses.   Musculoskeletal:  See skin exam. Upper extremities: Normal ROM to bilateral elbows, wrists, and all digits. No point/focal bony tenderness.   Neurological: She is alert.  Clear speech.   Skin:  LUE: Dorsum of mid forearm, 2cm diameter area of erythema with swelling and mild warmth. Palpable induration with +/- central fluctuance. Tender to palpation.   Psychiatric: She has a normal mood and affect. Her behavior is normal. Thought content normal.  Nursing note and vitals reviewed.    ED Treatments / Results  Labs (all labs ordered are listed, but only  abnormal results are displayed) Labs Reviewed - No data to display  EKG None  Radiology No results found.  Procedures EMERGENCY DEPARTMENT US SOFT TISSUE INTERPRETATION "Study: Limited Soft Tissue Ultrasound"  INDICATIONS: Pain Multiple views of the body part were obtained in real-time with a multi-frequency linear probe  PERFORMED BY: Myself IMAGES ARCHIVED?: No SIDE:Left BODY PART:Upper extremity INTERPRETATION:  very small abscess with surroudning cellulitis   .Marland KitchenIncision and Drainage Date/Time: 08/25/2017 11:13 AM Performed by: Cherly Anderson, PA-C Authorized by: Cherly Anderson, PA-C   Consent:    Consent obtained:  Verbal   Consent given by:  Patient   Risks discussed:  Bleeding, damage to other organs, infection, incomplete drainage and pain   Alternatives discussed:  No treatment and alternative treatment Location:    Type:  Abscess   Size:  2   Location:  Upper extremity   Upper extremity location: forearm. Pre-procedure details:    Skin preparation:  Betadine Anesthesia (see MAR for exact dosages):    Anesthesia method:  Local infiltration   Local anesthetic:  Lidocaine 1% w/o epi Procedure type:    Complexity:  Simple Procedure details:    Incision types:  Stab incision   Scalpel blade:  11   Wound management:  Probed and deloculated and irrigated with saline   Drainage:  Bloody and purulent   Drainage amount:  Scant   Wound treatment:  Wound left open   Packing materials:  None Post-procedure details:    Patient tolerance of procedure:  Tolerated well, no immediate complications   (including critical care time)  Medications Ordered in ED Medications - No data to display   Initial Impression / Assessment and Plan / ED Course  I have reviewed the triage vital signs and the nursing notes.  Pertinent labs & imaging results that were available during my care of the patient were reviewed by me and considered in my medical decision  making (see chart for details).   Patient presents to the ED with L forearm abscess and surrounding cellulitis. I&D per procedure note above, mild to scant drainage, tolerated well, NVI distal prior to and following procedure. Given minimal drainage with surrounding cellulitis will start on doxycycline with recommendations for warm soaks and wound recheck in 48 hours. I discussed results, treatment plan, need for follow-up, and return precautions with the patient. Provided opportunity for questions, patient confirmed understanding and is in agreement with plan.    Final Clinical Impressions(s) / ED Diagnoses   Final diagnoses:  Cellulitis and abscess of upper extremity    ED Discharge Orders  Ordered    doxycycline (VIBRAMYCIN) 100 MG capsule  2 times daily     08/25/17 1115           Brandi Armato, CourtlandSamantha R, PA-C 08/25/17 1145    Cathren LaineSteinl, Kevin, MD 08/25/17 1500

## 2017-08-25 NOTE — ED Triage Notes (Signed)
Pt states about 3 weeks ago something bit her on her left forearm, pt has boil that has gotten larger and was oozing purulent drainage.

## 2017-10-01 ENCOUNTER — Encounter (HOSPITAL_COMMUNITY): Payer: Self-pay | Admitting: *Deleted

## 2017-10-01 ENCOUNTER — Emergency Department (HOSPITAL_COMMUNITY)
Admission: EM | Admit: 2017-10-01 | Discharge: 2017-10-01 | Disposition: A | Discharge status: Home / Self Care | Payer: Self-pay | Attending: Emergency Medicine | Admitting: Emergency Medicine

## 2017-10-01 ENCOUNTER — Other Ambulatory Visit: Payer: Self-pay

## 2017-10-01 DIAGNOSIS — S025XXB Fracture of tooth (traumatic), initial encounter for open fracture: Secondary | ICD-10-CM

## 2017-10-01 DIAGNOSIS — S025XXA Fracture of tooth (traumatic), initial encounter for closed fracture: Secondary | ICD-10-CM | POA: Insufficient documentation

## 2017-10-01 DIAGNOSIS — Z79899 Other long term (current) drug therapy: Secondary | ICD-10-CM | POA: Insufficient documentation

## 2017-10-01 DIAGNOSIS — Y929 Unspecified place or not applicable: Secondary | ICD-10-CM | POA: Insufficient documentation

## 2017-10-01 DIAGNOSIS — X58XXXA Exposure to other specified factors, initial encounter: Secondary | ICD-10-CM | POA: Insufficient documentation

## 2017-10-01 DIAGNOSIS — Y999 Unspecified external cause status: Secondary | ICD-10-CM | POA: Insufficient documentation

## 2017-10-01 DIAGNOSIS — Y939 Activity, unspecified: Secondary | ICD-10-CM | POA: Insufficient documentation

## 2017-10-01 DIAGNOSIS — K0889 Other specified disorders of teeth and supporting structures: Secondary | ICD-10-CM

## 2017-10-01 MED ORDER — AMOXICILLIN 500 MG PO CAPS: 500.0000 mg | ORAL_CAPSULE | Freq: Three times a day (TID) | ORAL | 0 refills | Status: DC

## 2017-10-01 NOTE — ED Notes (Signed)
ED Provider at bedside. 

## 2017-10-01 NOTE — ED Notes (Signed)
Pt verbalized understanding of discharge instructions and denies any further questions at this time.   

## 2017-10-01 NOTE — Discharge Instructions (Signed)
Thank you for allowing me to care for you today in the Emergency Department.   I have attached a resource guide with dentist located in the area.  Sometimes you need to call everyone on the list.  I would recommend doing this sooner rather than later as most dental offices are closed on Friday.  Take 600 mg of ibuprofen with food or 650 mg of Tylenol once every 6 hours for pain control.  Take 1 tablet of amoxicillin 3 times daily for the next week.  Gargle and spit warm salt water up to 5 times a day to help keep the mouth clean and to avoid spread of infection.  Return to the emergency department if you develop new or worsening symptoms, including if you become unable to open your mouth, if you develop significant swelling to one side of your face or neck, if you start having drooling because you are unable to swallow your own saliva, or you feel as if your throat is closing.

## 2017-10-01 NOTE — ED Triage Notes (Signed)
Pt in c/o dental pain on the left lower jaw and right upper, pain is worse with chewing and swallowing, no distress noted

## 2017-10-01 NOTE — ED Provider Notes (Signed)
[facility] EMERGENCY DEPARTMENT Provider Note   CSN: [id number] Arrival date & time: 10/01/17  0744     History   Chief Complaint Chief Complaint  Patient presents with  . Dental Pain    HPI Jennifer Burnett is a 32 y.o. female with a history of sickle cell trait and PID who presents to the emergency department with a chief complaint of dental pain.  The patient endorses left lower and right upper sided dental pain that is been constant for the last week.  She characterizes the pain as throbbing.  Pain is worse at night.  She has been taking ibuprofen and Tylenol without improvement at home.  No other known alleviating factors.  She denies fever, chills, trismus, facial or neck swelling, or drainage from the teeth.  She is not established with a dentist.  The history is provided by the patient. No language interpreter was used.    Past Medical History:  Diagnosis Date  . PID (acute pelvic inflammatory disease)    as teenager  . Sickle cell trait (HCC)     There are no active problems to display for this patient.   Past Surgical History:  Procedure Laterality Date  . CESAREAN SECTION  10/31/2011   Procedure: CESAREAN SECTION;  Surgeon: Brock Bad, MD;  Location: [facility];  Service: Obstetrics;  Laterality: N/A;  . NO PAST SURGERIES       OB History    Gravida  1   Para  1   Term  0   Preterm  1   AB  0   Living  1     SAB  0   TAB  0   Ectopic  0   Multiple  0   Live Births  1            Home Medications    Prior to Admission medications   Medication Sig Start Date End Date Taking? Authorizing Provider  amoxicillin (AMOXIL) 500 MG capsule Take 1 capsule (500 mg total) by mouth 3 (three) times daily. 10/01/17   McDonald, Mia A, PA-C  cetirizine (ZYRTEC) 10 MG tablet Take 10 mg by mouth daily.    [provider]  doxycycline (VIBRAMYCIN) 100 MG capsule Take 1 capsule (100 mg total) by mouth 2 (two) times daily.  08/25/17   Petrucelli, Samantha R, PA-C  famotidine (PEPCID) 40 MG tablet Take 1 tablet (40 mg total) by mouth daily. 05/11/17   Maczis, Elmer Sow, PA-C  Iron-FA-B Cmp-C-Biot-Probiotic (FUSION PLUS) CAPS Take 1 capsule by mouth daily before breakfast. 09/21/16   Fayrene Helper, PA-C  predniSONE (DELTASONE) 20 MG tablet Take 2 tablets (40 mg total) by mouth daily with breakfast. 05/11/17   Maczis, Elmer Sow, PA-C    Family History Family History  Problem Relation Age of Onset  . Anesthesia problems Neg Hx   . Hypotension Neg Hx   . Malignant hyperthermia Neg Hx   . Pseudochol deficiency Neg Hx   . Other Neg Hx     Social History Social History   Tobacco Use  . Smoking status: Never Smoker  . Smokeless tobacco: Never Used  Substance Use Topics  . Alcohol use: No  . Drug use: No     Allergies   Patient has no known allergies.   Review of Systems Review of Systems  Constitutional: Negative for activity change, chills and fever.  HENT: Positive for dental problem. Negative for drooling, sore throat, trouble swallowing and  voice change.   Respiratory: Negative for shortness of breath.   Cardiovascular: Negative for chest pain.  Gastrointestinal: Negative for abdominal pain.  Musculoskeletal: Negative for back pain.  Skin: Negative for rash.   Physical Exam Updated Vital Signs BP (!) 124/95   Pulse 71   Temp 97.9 F (36.6 C) (Oral)   Resp 16   SpO2 100%   Physical Exam  Constitutional: No distress.  HENT:  Head: Normocephalic.  Mouth/Throat: Uvula is midline, oropharynx is clear and moist and mucous membranes are normal. No trismus in the jaw. No uvula swelling.    Tooth #2 is fracture on the facial surface along the gumline. Tooth #19 is fractured along the lingual surface.  Gingival irritation is noted at tooth 19 along the facial surface.  No gross abscess.  No sublingual induration or tenderness.  No facial or neck swelling.  Eyes: Conjunctivae are normal.  Neck: Neck  supple.  Cardiovascular: Normal rate and regular rhythm. Exam reveals no gallop and no friction rub.  No murmur heard. Pulmonary/Chest: Effort normal. No respiratory distress.  Abdominal: Soft. She exhibits no distension.  Neurological: She is alert.  Skin: Skin is warm. No rash noted.  Psychiatric: Her behavior is normal.  Nursing note and vitals reviewed.    ED Treatments / Results  Labs (all labs ordered are listed, but only abnormal results are displayed) Labs Reviewed - No data to display  EKG None  Radiology No results found.  Procedures Dental Block Date/Time: 10/01/2017 10:36 AM Performed by: Barkley BoardsMcDonald, Mia A, PA-C Authorized by: Barkley BoardsMcDonald, Mia A, PA-C   Consent:    Consent obtained:  Verbal   Consent given by:  Patient   Risks discussed:  Allergic reaction, infection, nerve damage, swelling, unsuccessful block, pain, intravascular injection and hematoma   Alternatives discussed:  No treatment Indications:    Indications: dental pain   Location:    Block type:  Supraperiosteal Procedure details (see MAR for exact dosages):    Needle gauge:  27 G   Anesthetic injected:  Bupivacaine 0.25% WITH epi   Injection procedure:  Anatomic landmarks identified, introduced needle, incremental injection, negative aspiration for blood and anatomic landmarks palpated Post-procedure details:    Outcome:  Pain relieved   Patient tolerance of procedure:  Tolerated well, no immediate complications   (including critical care time)  Medications Ordered in ED Medications - No data to display   Initial Impression / Assessment and Plan / ED Course  I have reviewed the triage vital signs and the nursing notes.  Pertinent labs & imaging results that were available during my care of the patient were reviewed by me and considered in my medical decision making (see chart for details).     32 year old female presenting with dentalgia for 1 week.  Tooth to and 19 are fractured.   Supraperiosteal dental block was performed over tooth to and Dycal was placed.  We will start the patient on 500 mg of amoxicillin 3 times daily for prophylaxis since there is some gingivitis present around tooth 19. No gross abscess.  Exam unconcerning for Ludwig's angina or spread of infection. Urged patient to follow-up with dentist.  Strict return precautions given.  The patient is hemodynamically stable and in no acute distress.  She is safe for outpatient follow-up at this time.  Final Clinical Impressions(s) / ED Diagnoses   Final diagnoses:  Dentalgia  Open fracture of tooth, initial encounter    ED Discharge Orders  Ordered    amoxicillin (AMOXIL) 500 MG capsule  3 times daily     10/01/17 1005           McDonald, Coral Else, PA-C 10/01/17 1038    Terrilee Files, MD 10/02/17 1806

## 2018-03-08 ENCOUNTER — Encounter (HOSPITAL_COMMUNITY): Payer: Self-pay | Admitting: Emergency Medicine

## 2018-03-08 ENCOUNTER — Other Ambulatory Visit: Payer: Self-pay

## 2018-03-08 ENCOUNTER — Emergency Department (HOSPITAL_COMMUNITY)
Admission: EM | Admit: 2018-03-08 | Discharge: 2018-03-08 | Disposition: A | Discharge status: Home / Self Care | Payer: Self-pay | Attending: Emergency Medicine | Admitting: Emergency Medicine

## 2018-03-08 DIAGNOSIS — B9789 Other viral agents as the cause of diseases classified elsewhere: Secondary | ICD-10-CM

## 2018-03-08 DIAGNOSIS — J069 Acute upper respiratory infection, unspecified: Secondary | ICD-10-CM

## 2018-03-08 DIAGNOSIS — Z79899 Other long term (current) drug therapy: Secondary | ICD-10-CM | POA: Insufficient documentation

## 2018-03-08 NOTE — ED Provider Notes (Signed)
[facility] EMERGENCY DEPARTMENT Provider Note   CSN: [id number] Arrival date & time: 03/08/18  1154    History   Chief Complaint Chief Complaint  Patient presents with  . Flu-Like Symptoms    HPI Jennifer Burnett is a 33 y.o. female.     The history is provided by the patient.  URI  Presenting symptoms: congestion, cough, fatigue and rhinorrhea   Presenting symptoms: no fever   Severity:  Moderate Onset quality:  Gradual Duration:  3 days Timing:  Constant Progression:  Unchanged Chronicity:  New Relieved by:  OTC medications Worsened by:  Nothing Ineffective treatments:  None tried Associated symptoms: no headaches, no myalgias, no neck pain and no wheezing   Risk factors: sick contacts   Risk factors: no immunosuppression, no recent illness and no recent travel     Past Medical History:  Diagnosis Date  . PID (acute pelvic inflammatory disease)    as teenager  . Sickle cell trait (HCC)     There are no active problems to display for this patient.   Past Surgical History:  Procedure Laterality Date  . CESAREAN SECTION  10/31/2011   Procedure: CESAREAN SECTION;  Surgeon: Brock Bad, MD;  Location: [facility];  Service: Obstetrics;  Laterality: N/A;  . NO PAST SURGERIES       OB History    Gravida  1   Para  1   Term  0   Preterm  1   AB  0   Living  1     SAB  0   TAB  0   Ectopic  0   Multiple  0   Live Births  1            Home Medications    Prior to Admission medications   Medication Sig Start Date End Date Taking? Authorizing Provider  amoxicillin (AMOXIL) 500 MG capsule Take 1 capsule (500 mg total) by mouth 3 (three) times daily. 10/01/17   McDonald, Mia A, PA-C  cetirizine (ZYRTEC) 10 MG tablet Take 10 mg by mouth daily.    [provider]  doxycycline (VIBRAMYCIN) 100 MG capsule Take 1 capsule (100 mg total) by mouth 2 (two) times daily. 08/25/17   Petrucelli, Samantha R, PA-C  famotidine  (PEPCID) 40 MG tablet Take 1 tablet (40 mg total) by mouth daily. 05/11/17   Maczis, Elmer Sow, PA-C  Iron-FA-B Cmp-C-Biot-Probiotic (FUSION PLUS) CAPS Take 1 capsule by mouth daily before breakfast. 09/21/16   Fayrene Helper, PA-C  predniSONE (DELTASONE) 20 MG tablet Take 2 tablets (40 mg total) by mouth daily with breakfast. 05/11/17   Maczis, Elmer Sow, PA-C    Family History Family History  Problem Relation Age of Onset  . Anesthesia problems Neg Hx   . Hypotension Neg Hx   . Malignant hyperthermia Neg Hx   . Pseudochol deficiency Neg Hx   . Other Neg Hx     Social History Social History   Tobacco Use  . Smoking status: Never Smoker  . Smokeless tobacco: Never Used  Substance Use Topics  . Alcohol use: No  . Drug use: No     Allergies   Patient has no known allergies.   Review of Systems Review of Systems  Constitutional: Positive for fatigue. Negative for fever.  HENT: Positive for congestion and rhinorrhea.   Respiratory: Positive for cough. Negative for wheezing.   Musculoskeletal: Negative for myalgias and neck pain.  Neurological: Negative for headaches.  All other systems reviewed and are negative.    Physical Exam Updated Vital Signs BP (!) 143/96 (BP Location: Right Arm)   Pulse 76   Temp 98.1 F (36.7 C) (Oral)   Resp 20   Ht 5\' 7"  (1.702 m)   Wt 59 kg   LMP 03/01/2018   SpO2 100%   BMI 20.36 kg/m   Physical Exam Vitals signs and nursing note reviewed.  Constitutional:      General: She is not in acute distress.    Appearance: She is well-developed.  HENT:     Head: Normocephalic and atraumatic.     Right Ear: Tympanic membrane normal.     Left Ear: Tympanic membrane normal.     Nose: Congestion present.     Mouth/Throat:     Mouth: Mucous membranes are moist.     Pharynx: Posterior oropharyngeal erythema present. No oropharyngeal exudate.  Eyes:     Pupils: Pupils are equal, round, and reactive to light.  Cardiovascular:     Rate and  Rhythm: Normal rate and regular rhythm.     Heart sounds: Normal heart sounds. No murmur. No friction rub.  Pulmonary:     Effort: Pulmonary effort is normal.     Breath sounds: Normal breath sounds. No wheezing or rales.  Abdominal:     General: Bowel sounds are normal. There is no distension.     Palpations: Abdomen is soft.     Tenderness: There is no abdominal tenderness. There is no guarding or rebound.  Musculoskeletal: Normal range of motion.        General: No tenderness.     Comments: No edema  Lymphadenopathy:     Cervical: No cervical adenopathy.  Skin:    General: Skin is warm and dry.     Findings: No rash.  Neurological:     Mental Status: She is alert and oriented to person, place, and time.     Cranial Nerves: No cranial nerve deficit.  Psychiatric:        Behavior: Behavior normal.      ED Treatments / Results  Labs (all labs ordered are listed, but only abnormal results are displayed) Labs Reviewed - No data to display  EKG None  Radiology No results found.  Procedures Procedures (including critical care time)  Medications Ordered in ED Medications - No data to display   Initial Impression / Assessment and Plan / ED Course  I have reviewed the triage vital signs and the nursing notes.  Pertinent labs & imaging results that were available during my care of the patient were reviewed by me and considered in my medical decision making (see chart for details).        Pt with symptoms consistent with viral URI.  Well appearing here.  No signs of breathing difficulty  No signs of pharyngitis, otitis or abnormal abdominal findings.    pt to return with any further problems.  Final Clinical Impressions(s) / ED Diagnoses   Final diagnoses:  Viral URI with cough    ED Discharge Orders    None       Gwyneth Sprout, MD 03/08/18 1233

## 2018-03-08 NOTE — ED Notes (Signed)
Patient verbalizes understanding of discharge instructions. Opportunity for questioning and answers were provided. Armband removed by staff, pt discharged from ED home via POV with family. 

## 2018-03-08 NOTE — ED Triage Notes (Signed)
Pt reports some flu-like symptoms for the past 3 days. Pt reports she is having body aches and nasal congestion. Pt reports her son just got over the flu 2.5 weeks ago.

## 2018-03-08 NOTE — Discharge Instructions (Signed)
Continue to use the DayQuil, drink plenty of fluids and get lots of rest

## 2018-05-20 ENCOUNTER — Other Ambulatory Visit: Payer: Self-pay

## 2018-05-20 ENCOUNTER — Emergency Department (HOSPITAL_COMMUNITY)
Admission: EM | Admit: 2018-05-20 | Discharge: 2018-05-20 | Disposition: A | Discharge status: Home / Self Care | Payer: Self-pay | Attending: Emergency Medicine | Admitting: Emergency Medicine

## 2018-05-20 ENCOUNTER — Encounter (HOSPITAL_COMMUNITY): Payer: Self-pay | Admitting: Emergency Medicine

## 2018-05-20 DIAGNOSIS — K047 Periapical abscess without sinus: Secondary | ICD-10-CM | POA: Insufficient documentation

## 2018-05-20 DIAGNOSIS — Z79899 Other long term (current) drug therapy: Secondary | ICD-10-CM | POA: Insufficient documentation

## 2018-05-20 DIAGNOSIS — D573 Sickle-cell trait: Secondary | ICD-10-CM | POA: Insufficient documentation

## 2018-05-20 DIAGNOSIS — I10 Essential (primary) hypertension: Secondary | ICD-10-CM

## 2018-05-20 MED ORDER — AMOXICILLIN 500 MG PO CAPS: 500.0000 mg | ORAL_CAPSULE | Freq: Three times a day (TID) | ORAL | 0 refills | Status: DC

## 2018-05-20 NOTE — Discharge Instructions (Addendum)
While in the ED your blood pressure was high.  Please follow up with your primary care doctor or the wellness clinic for repeat evaluation as you may need medication.  High blood pressure can cause long term, potentially serious, damage if left untreated.  ° °Please take Ibuprofen (Advil, motrin) and Tylenol (acetaminophen) to relieve your pain.  You may take up to 600 MG (3 pills) of normal strength ibuprofen every 8 hours as needed.  In between doses of ibuprofen you make take tylenol, up to 1,000 mg (two extra strength pills).  Do not take more than 3,000 mg tylenol in a 24 hour period.  Please check all medication labels as many medications such as pain and cold medications may contain tylenol.  Do not drink alcohol while taking these medications.  Do not take other NSAID'S while taking ibuprofen (such as aleve or naproxen).  Please take ibuprofen with food to decrease stomach upset. ° °You may have diarrhea from the antibiotics.  It is very important that you continue to take the antibiotics even if you get diarrhea unless a medical professional tells you that you may stop taking them.  If you stop too early the bacteria you are being treated for will become stronger and you may need different, more powerful antibiotics that have more side effects and worsening diarrhea.  Please stay well hydrated and consider probiotics as they may decrease the severity of your diarrhea.  Please be aware that if you take any hormonal contraception (birth control pills, nexplanon, the ring, etc) that your birth control will not work while you are taking antibiotics and you need to use back up protection as directed on the birth control medication information insert.  ° °

## 2018-05-20 NOTE — ED Provider Notes (Signed)
[facility] Provider Note   CSN: [id number] Arrival date & time: 05/20/18  [id number]    History   Chief Complaint Chief Complaint  Patient presents with  . Dental Pain  . Facial Swelling    HPI Jennifer Burnett is a 33 y.o. female with no significant past medical history who presents today for evaluation of left-sided facial swelling.  She reports that she has a broken tooth on the left upper side that is increasingly painful.  She denies any intraoral swelling.  She reports that the pain worsened 2 days ago and she started noting facial swelling last night that has been gradually worsening.  She has been trying ibuprofen and Tylenol OTC without significant resolution of symptoms.  She does not have a dentist.  No fevers, difficulty swallowing, drooling, or voice change.       HPI  Past Medical History:  Diagnosis Date  . PID (acute pelvic inflammatory disease)    as teenager  . Sickle cell trait (HCC)     There are no active problems to display for this patient.   Past Surgical History:  Procedure Laterality Date  . CESAREAN SECTION  10/31/2011   Procedure: CESAREAN SECTION;  Surgeon: Brock Badharles A Harper, MD;  Location: [facility];  Service: Obstetrics;  Laterality: N/A;  . NO PAST SURGERIES       OB History    Gravida  1   Para  1   Term  0   Preterm  1   AB  0   Living  1     SAB  0   TAB  0   Ectopic  0   Multiple  0   Live Births  1            Home Medications    Prior to Admission medications   Medication Sig Start Date End Date Taking? Authorizing Provider  amoxicillin (AMOXIL) 500 MG capsule Take 1 capsule (500 mg total) by mouth 3 (three) times daily. 05/20/18   Cristina GongHammond, Yanet Balliet W, PA-C  cetirizine (ZYRTEC) 10 MG tablet Take 10 mg by mouth daily.    [provider]  doxycycline (VIBRAMYCIN) 100 MG capsule Take 1 capsule (100 mg total) by mouth 2 (two) times daily. 08/25/17   Petrucelli, Samantha R,  PA-C  famotidine (PEPCID) 40 MG tablet Take 1 tablet (40 mg total) by mouth daily. 05/11/17   Maczis, Elmer SowMichael M, PA-C  Iron-FA-B Cmp-C-Biot-Probiotic (FUSION PLUS) CAPS Take 1 capsule by mouth daily before breakfast. 09/21/16   Fayrene Helperran, Bowie, PA-C  predniSONE (DELTASONE) 20 MG tablet Take 2 tablets (40 mg total) by mouth daily with breakfast. 05/11/17   Maczis, Elmer SowMichael M, PA-C    Family History Family History  Problem Relation Age of Onset  . Anesthesia problems Neg Hx   . Hypotension Neg Hx   . Malignant hyperthermia Neg Hx   . Pseudochol deficiency Neg Hx   . Other Neg Hx     Social History Social History   Tobacco Use  . Smoking status: Never Smoker  . Smokeless tobacco: Never Used  Substance Use Topics  . Alcohol use: No  . Drug use: No     Allergies   Patient has no known allergies.   Review of Systems Review of Systems  Constitutional: Negative for chills and fever.  HENT: Positive for dental problem and facial swelling. Negative for drooling, sore throat, trouble swallowing and voice change.   Respiratory: Negative for chest  tightness and shortness of breath.   All other systems reviewed and are negative.    Physical Exam Updated Vital Signs BP (!) 156/100 (BP Location: Right Arm)   Pulse (!) 105   Temp 98.5 F (36.9 C) (Oral)   Resp 18   Wt 59 kg   SpO2 100%   BMI 20.37 kg/m   Physical Exam Vitals signs and nursing note reviewed.  Constitutional:      General: She is not in acute distress.    Appearance: She is not ill-appearing.  HENT:     Head:     Jaw: There is normal jaw occlusion. No trismus or tenderness.     Comments: Left-sided mild edema over the left maxilla.    Right Ear: Tympanic membrane, ear canal and external ear normal.     Left Ear: Tympanic membrane, ear canal and external ear normal.     Nose: Nose normal.     Mouth/Throat:     Lips: Pink.     Mouth: Mucous membranes are moist.     Dentition: No gingival swelling.     Tongue:  Tongue does not deviate from midline.     Pharynx: Oropharynx is clear. Uvula midline. No pharyngeal swelling, posterior oropharyngeal erythema or uvula swelling.     Tonsils: No tonsillar exudate or tonsillar abscesses.     Comments: Left first maxillary molar is broken.  No dental abscess or swelling. Eyes:     Comments: Full, pain-free, EOM  Cardiovascular:     Rate and Rhythm: Normal rate.  Pulmonary:     Effort: Pulmonary effort is normal. No respiratory distress.  Neurological:     General: No focal deficit present.     Mental Status: She is alert.      ED Treatments / Results  Labs (all labs ordered are listed, but only abnormal results are displayed) Labs Reviewed - No data to display  EKG None  Radiology No results found.  Procedures Procedures (including critical care time)  Medications Ordered in ED Medications - No data to display   Initial Impression / Assessment and Plan / ED Course  I have reviewed the triage vital signs and the nursing notes.  Pertinent labs & imaging results that were available during my care of the patient were reviewed by me and considered in my medical decision making (see chart for details).       Patient presents today for evaluation of left-sided facial swelling.  She has a broken left sided maxillary molar with associated facial swelling.  She has full, pain-free, extraocular range of motion.  No trismus, uvula deviation, no concern for deep space spread of infection.  She will be treated with OTC pain medicine and amoxicillin.  Last Tdap was 6 years ago.  She is given Insurance claims handler.  While in the emergency room patient's blood pressure was incidentally noted to be elevated.  She does not have a history of this.  She was informed of this and the need to follow-up with PCP for recheck.  Return precautions were discussed with patient who states their understanding.  At the time of discharge patient denied any unaddressed  complaints or concerns.  Patient is agreeable for discharge home.   Final Clinical Impressions(s) / ED Diagnoses   Final diagnoses:  Dental infection    ED Discharge Orders         Ordered    amoxicillin (AMOXIL) 500 MG capsule  3 times daily  05/20/18 0728           Cristina Gong, PA-C 05/20/18 3704    Tegeler, Canary Brim, MD 05/20/18 [phone]

## 2018-05-20 NOTE — ED Triage Notes (Signed)
Pt in with c/o L upper face swelling x 2 days, also having dental pain. Denies fevers or sob, states trouble swallowing this am, denies any itching/allergy symptoms

## 2018-10-15 ENCOUNTER — Encounter (HOSPITAL_COMMUNITY): Payer: Self-pay

## 2018-10-15 ENCOUNTER — Other Ambulatory Visit: Payer: Self-pay

## 2018-10-15 ENCOUNTER — Ambulatory Visit (HOSPITAL_COMMUNITY)
Admission: EM | Admit: 2018-10-15 | Discharge: 2018-10-15 | Disposition: A | Discharge status: Home / Self Care | Payer: Self-pay | Attending: Emergency Medicine | Admitting: Emergency Medicine

## 2018-10-15 DIAGNOSIS — M7918 Myalgia, other site: Secondary | ICD-10-CM

## 2018-10-15 DIAGNOSIS — R1030 Lower abdominal pain, unspecified: Secondary | ICD-10-CM

## 2018-10-15 MED ORDER — METHOCARBAMOL 500 MG PO TABS: 500.0000 mg | ORAL_TABLET | Freq: Two times a day (BID) | ORAL | 0 refills | Status: AC | PRN

## 2018-10-15 MED ORDER — IBUPROFEN 600 MG PO TABS: 600.0000 mg | ORAL_TABLET | Freq: Four times a day (QID) | ORAL | 0 refills | Status: DC | PRN
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
## 2018-10-15 NOTE — Discharge Instructions (Signed)
Go to the emergency department if you have worsening abdominal pain or back pain.    Take the prescribed ibuprofen as needed for your pain.  Take the muscle relaxer Robaxin as needed for muscle spasm; do not drive, operate machinery, or drink alcohol with this medication as it may make you drowsy.

## 2019-01-18 ENCOUNTER — Other Ambulatory Visit: Payer: Self-pay

## 2019-01-18 ENCOUNTER — Emergency Department (HOSPITAL_COMMUNITY): Admission: EM | Admit: 2019-01-18 | Discharge: 2019-01-18 | Discharge status: Against Medical Advice | Payer: Self-pay

## 2019-01-19 ENCOUNTER — Other Ambulatory Visit: Payer: Self-pay

## 2019-01-19 ENCOUNTER — Encounter (HOSPITAL_COMMUNITY): Payer: Self-pay

## 2019-01-19 ENCOUNTER — Emergency Department (HOSPITAL_COMMUNITY)
Admission: EM | Admit: 2019-01-19 | Discharge: 2019-01-19 | Disposition: A | Discharge status: Home / Self Care | Payer: Self-pay | Attending: Emergency Medicine | Admitting: Emergency Medicine

## 2019-01-19 DIAGNOSIS — K0889 Other specified disorders of teeth and supporting structures: Secondary | ICD-10-CM | POA: Insufficient documentation

## 2019-01-19 MED ORDER — LIDOCAINE VISCOUS HCL 2 % MT SOLN
15.0000 mL | OROMUCOSAL | 2 refills | Status: AC | PRN
Start: 2019-01-19 — End: ?

## 2019-01-19 MED ORDER — BUPIVACAINE-EPINEPHRINE (PF) 0.5% -1:200000 IJ SOLN
1.8000 mL | Freq: Once | INTRAMUSCULAR | Status: AC
  Administered 2019-01-19: 11:00:00 1.8 mL

## 2019-01-19 MED ORDER — ONDANSETRON 4 MG PO TBDP: 4.0000 mg | ORAL_TABLET | Freq: Three times a day (TID) | ORAL | 0 refills | Status: DC | PRN

## 2019-01-19 MED ORDER — PENICILLIN V POTASSIUM 500 MG PO TABS: 500.0000 mg | ORAL_TABLET | Freq: Four times a day (QID) | ORAL | 0 refills | Status: AC

## 2019-01-19 NOTE — ED Triage Notes (Signed)
Pt states that she has a chipped tooth on the L upper side that is now swollen.

## 2019-01-19 NOTE — ED Provider Notes (Signed)
[facility] EMERGENCY DEPARTMENT Provider Note   CSN: [id number] Arrival date & time: 01/19/19  6237     History Chief Complaint  Patient presents with  . Oral Swelling    Jennifer Burnett is a 34 y.o. female.  HPI      Jennifer Burnett is a 34 y.o. female, with a history of dental pain, presenting to the ED with dental pain and facial swelling for last couple days. Pain to a left upper tooth is aching, moderate, radiating superiorly.  Accompanied by occasional nausea.  She states she has had an eroded tooth for several months, if not years.   She has had issues with acute pain and swelling previously, was seen and treated with antibiotics, but did not follow-up with a dentist due to financial concerns.  She has been intermittently taking ibuprofen, Tylenol, and performing mouth rinses with salt water.  Denies fever/chills, vomiting, chest pain, mouth swelling, sustained difficulty breathing, vision changes, or any other complaints.  Past Medical History:  Diagnosis Date  . PID (acute pelvic inflammatory disease)    as teenager  . Sickle cell trait ([place])     There are no problems to display for this patient.   Past Surgical History:  Procedure Laterality Date  . CESAREAN SECTION  10/31/2011   Procedure: CESAREAN SECTION;  Surgeon: Shelly Bombard, MD;  Location: [facility];  Service: Obstetrics;  Laterality: N/A;  . NO PAST SURGERIES       OB History    Gravida  1   Para  1   Term  0   Preterm  1   AB  0   Living  1     SAB  0   TAB  0   Ectopic  0   Multiple  0   Live Births  1           Family History  Problem Relation Age of Onset  . Anesthesia problems Neg Hx   . Hypotension Neg Hx   . Malignant hyperthermia Neg Hx   . Pseudochol deficiency Neg Hx   . Other Neg Hx     Social History   Tobacco Use  . Smoking status: Never Smoker  . Smokeless tobacco: Never Used  Substance Use Topics  . Alcohol use: No  . Drug use: No     Home Medications Prior to Admission medications   Medication Sig Start Date End Date Taking? Authorizing Provider  cetirizine (ZYRTEC) 10 MG tablet Take 10 mg by mouth daily.    [provider]  famotidine (PEPCID) 40 MG tablet Take 1 tablet (40 mg total) by mouth daily. 05/11/17   Maczis, Barth Kirks, PA-C  ibuprofen (ADVIL) 600 MG tablet Take 1 tablet (600 mg total) by mouth every 6 (six) hours as needed. 10/15/18   Sharion Balloon, NP  Iron-FA-B Cmp-C-Biot-Probiotic (FUSION PLUS) CAPS Take 1 capsule by mouth daily before breakfast. 09/21/16   Domenic Moras, PA-C  lidocaine (XYLOCAINE) 2 % solution Use as directed 15 mLs in the mouth or throat as needed for mouth pain. 01/19/19   Jawad Wiacek C, PA-C  methocarbamol (ROBAXIN) 500 MG tablet Take 1 tablet (500 mg total) by mouth 2 (two) times daily as needed for muscle spasms. 10/15/18   Sharion Balloon, NP  ondansetron (ZOFRAN ODT) 4 MG disintegrating tablet Take 1 tablet (4 mg total) by mouth every 8 (eight) hours as needed for nausea or vomiting. 01/19/19   Darwin Rothlisberger,  Ahmiya Abee C, PA-C  penicillin v potassium (VEETID) 500 MG tablet Take 1 tablet (500 mg total) by mouth 4 (four) times daily for 7 days. 01/19/19 01/26/19  Kesa Birky, [name], PA-C    Allergies    Patient has no known allergies.  Review of Systems   Review of Systems  Constitutional: Negative for chills and fever.  HENT: Positive for dental problem and facial swelling. Negative for drooling, sore throat, trouble swallowing and voice change.   Eyes: Negative for visual disturbance.  Respiratory: Negative for shortness of breath.   Cardiovascular: Negative for chest pain.  Gastrointestinal: Positive for nausea. Negative for vomiting.  Musculoskeletal: Negative for neck pain and neck stiffness.  All other systems reviewed and are negative.   Physical Exam Updated Vital Signs BP 129/83 (BP Location: Left Arm)   Pulse 92   Temp 97.9 F (36.6 C) (Oral)   Resp 18   SpO2 100%   Physical  Exam Vitals and nursing note reviewed.  Constitutional:      General: She is not in acute distress.    Appearance: She is well-developed. She is not diaphoretic.  HENT:     Head: Normocephalic and atraumatic.     Mouth/Throat:     Mouth: Mucous membranes are moist.     Pharynx: Oropharynx is clear.     Comments: Erosion to what appears to be the left first maxillary molar with sensitivity to touch in the area of this tooth.  No noted pulp exposure. There is swelling to the left upper face, however, it does not appear to cross the infraorbital rim.  No periorbital swelling.  No pain with EOMs.  She is able to fully close her eyelids. Dentition otherwise appears to be grossly intact and stable.  No noted area of intraoral swelling or fluctuance.  No trismus or noted abnormal phonation.  Mouth opening to at least 3 finger widths.  Handles oral secretions without difficulty. No sublingual swelling.  No swelling or tenderness to the submental or submandibular regions.  No swelling or tenderness into the soft tissues of the neck. Eyes:     Extraocular Movements: Extraocular movements intact.     Conjunctiva/sclera: Conjunctivae normal.  Cardiovascular:     Rate and Rhythm: Normal rate and regular rhythm.     Pulses: Normal pulses.          Radial pulses are 2+ on the right side and 2+ on the left side.  Pulmonary:     Effort: Pulmonary effort is normal. No respiratory distress.  Abdominal:     Tenderness: There is no guarding.  Musculoskeletal:     Cervical back: Normal range of motion and neck supple. No tenderness.  Lymphadenopathy:     Cervical: No cervical adenopathy.  Skin:    General: Skin is warm and dry.  Neurological:     Mental Status: She is alert.  Psychiatric:        Mood and Affect: Mood and affect normal.        Speech: Speech normal.        Behavior: Behavior normal.     ED Results / Procedures / Treatments   Labs (all labs ordered are listed, but only abnormal  results are displayed) Labs Reviewed - No data to display  EKG None  Radiology No results found.  Procedures Dental Block  Date/Time: 01/19/2019 10:19 AM Performed by: Anselm Pancoast, PA-C Authorized by: Anselm Pancoast, PA-C   Consent:    Consent obtained:  Verbal   Consent given by:  Patient   Risks discussed:  Allergic reaction, infection, swelling, unsuccessful block and pain Indications:    Indications: dental pain   Location:    Block type:  Posterior superior alveolar   Laterality:  Left Procedure details (see MAR for exact dosages):    Topical anesthetic:  Benzocaine gel   Needle gauge:  27 G   Anesthetic injected:  Bupivacaine 0.5% WITH epi   Injection procedure:  Anatomic landmarks identified, anatomic landmarks palpated, introduced needle, negative aspiration for blood and incremental injection Post-procedure details:    Outcome:  Anesthesia achieved   Patient tolerance of procedure:  Tolerated well, no immediate complications   (including critical care time)  Medications Ordered in ED Medications  bupivacaine-epinephrine (MARCAINE W/ EPI) 0.5% -1:200000 injection 1.8 mL (has no administration in time range)    ED Course  I have reviewed the triage vital signs and the nursing notes.  Pertinent labs & imaging results that were available during my care of the patient were reviewed by me and considered in my medical decision making (see chart for details).    MDM Rules/Calculators/A&P                      Patient presents with left upper dental pain with associated facial swelling.  Low suspicion for Ludwig's angioedema or sepsis.  Low suspicion for migrated infection at this time. Dental block performed without immediate complication.  Dentist follow-up recommended.  Resources given. The patient was given instructions for home care as well as return precautions. Patient voices understanding of these instructions, accepts the plan, and is comfortable with  discharge.   Final Clinical Impression(s) / ED Diagnoses Final diagnoses:  Pain, dental    Rx / DC Orders ED Discharge Orders         Ordered    penicillin v potassium (VEETID) 500 MG tablet  4 times daily     01/19/19 0957    lidocaine (XYLOCAINE) 2 % solution  As needed     01/19/19 0957    ondansetron (ZOFRAN ODT) 4 MG disintegrating tablet  Every 8 hours PRN     01/19/19 0957           Anselm Pancoast, PA-C 01/19/19 1035    Raeford Razor, MD 01/19/19 1228

## 2019-01-19 NOTE — Discharge Instructions (Addendum)
°  Dental Pain You have been seen today for dental pain. You should follow up with a dentist as soon as possible. This problem will not resolve on its own without the care of a dentist.  Lidocaine liquid: Use the viscous lidocaine for mouth pain. Swish with the lidocaine and spit it out. Do not swallow it. Salt water solution: You should also swish with a homemade salt water solution, twice a day.  Make this solution by mixing 8 ounces of warm water with about half a teaspoon of salt. Antiinflammatory medications: Take 600 mg of ibuprofen every 6 hours or 440 mg (over the counter dose) to 500 mg (prescription dose) of naproxen every 12 hours for the next 3 days. After this time, these medications may be used as needed for pain. Take these medications with food to avoid upset stomach. Choose only one of these medications, do not take them together. Acetaminophen (generic for Tylenol): Should you continue to have additional pain while taking the ibuprofen or naproxen, you may add in acetaminophen as needed. Your daily total maximum amount of acetaminophen from all sources should be limited to 4000mg/day for persons without liver problems, or 2000mg/day for those with liver problems.  Nausea/vomiting: Use the ondansetron (generic for Zofran) for nausea or vomiting.  This medication may not prevent all vomiting or nausea, but can help facilitate better hydration. Things that can help with nausea/vomiting also include peppermint/menthol candies, vitamin B12, and ginger.  Please take all of your antibiotics until finished!   You may develop abdominal discomfort or diarrhea from the antibiotic.  You may help offset this with probiotics which you can buy or get in yogurt. Do not eat or take the probiotics until 2 hours after your antibiotic.   For prescription assistance, may try using prescription discount sites or apps, such as goodrx.com  Dental Resource Guide  [facility] [address], [address] [place], [place] [address] [phone]  [facility] [place] [address] [place], [place] [address] [phone]  [facility] [address] [place], [place] [address] [phone]. [phone]  [facility] [address] [place], [place] [address] [phone]  [facility] [address] [place], [place] [address] [phone]  [employer] Www.denistry.[employer].edu/patientcare/studentclinics/becomepatient  [employer] [address] [place], [place] [address] [phone]  Website for free, low-income, or sliding scale dental services in [place]: www.freedentalcare.us  To find a dentist in [place] and surrounding areas: www.ncdental.org/for-the-public/find-a-dentist  [facility] http://www.ncdental.org/meetings-events/[place]  [place] Medicaid Dentist https://dma.ncdhhs.gov/find-a-doctor/medicaid-dental-providers  

## 2019-05-16 IMAGING — CT CT ANGIO CHEST
2 of 12 series · 19 of 46 positions shown · IV contrast (isovue)
Comparison: None.

CLINICAL DATA: Pt reports chest pain, severe on left side and left
arm weakness onset yesterday. PE suspected.

EXAM:
CT ANGIOGRAPHY CHEST WITH CONTRAST
TECHNIQUE: Multidetector CT imaging of the chest was performed using the
standard protocol during bolus administration of intravenous
contrast. Multiplanar CT image reconstructions and MIPs were
obtained to evaluate the vascular anatomy.
CONTRAST:  60 ml isovue 370 iv

[Series 10: thins · axial · 0.54mm/px · z∈[-480,-230]mm · 16 of 278 slices shown]
[im 14/278  lung]
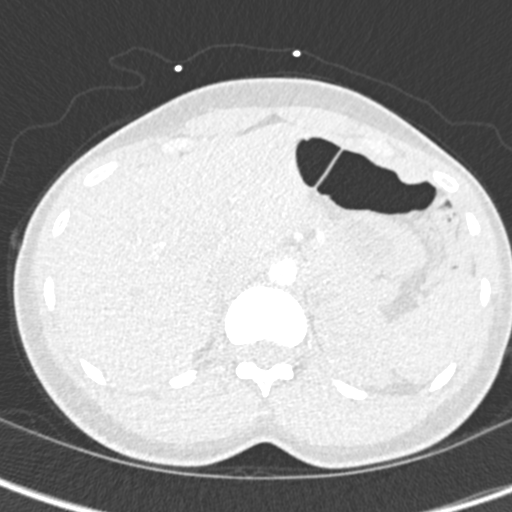
[im 28/278  soft-tissue]
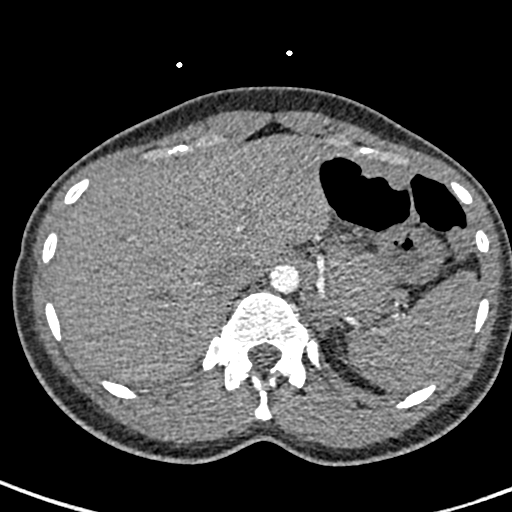
[im 42/278  lung]
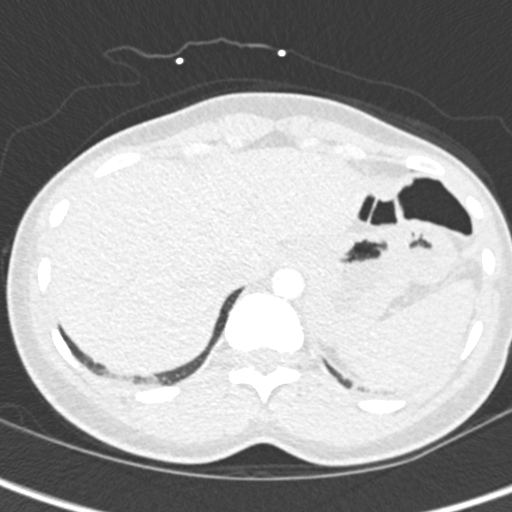
[im 70/278  soft-tissue]
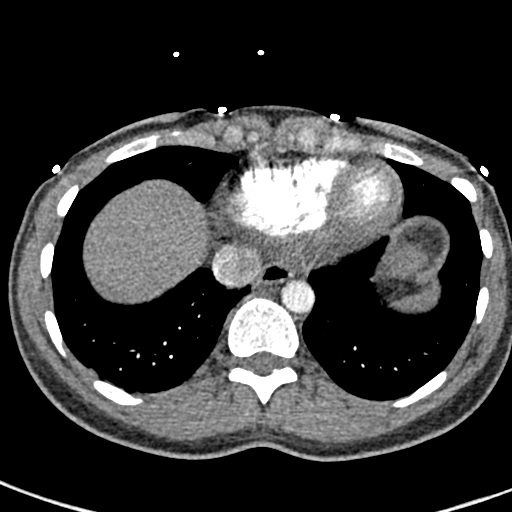
[im 84/278  lung]
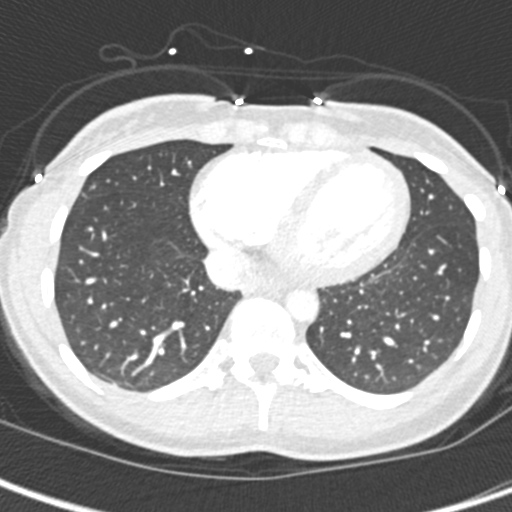
[im 97/278  soft-tissue]
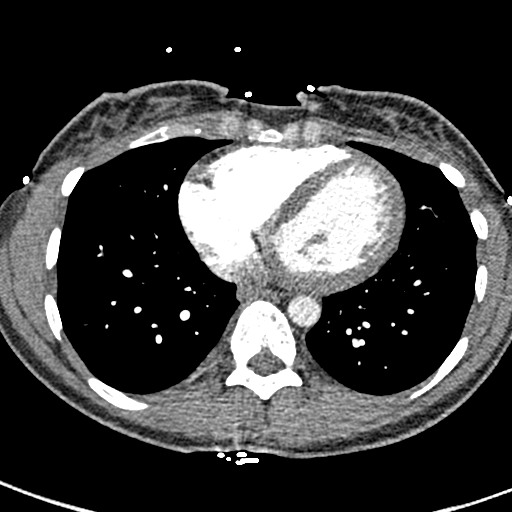
[im 111/278  lung]
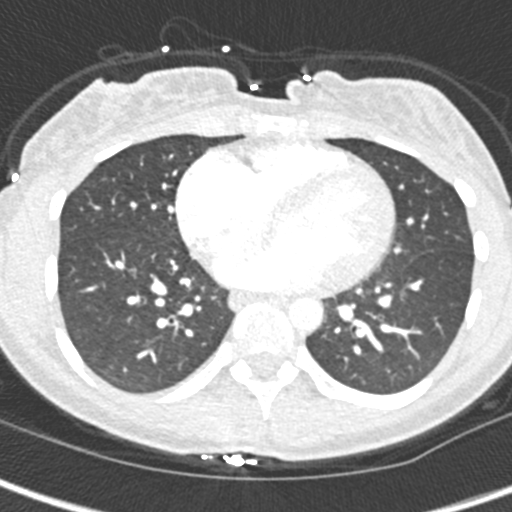
[im 125/278  soft-tissue]
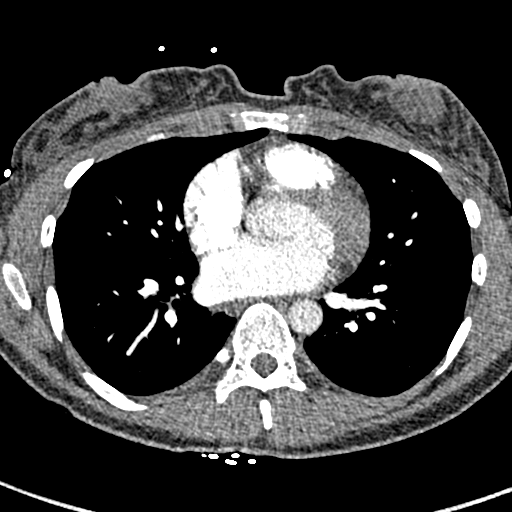
[im 153/278  lung]
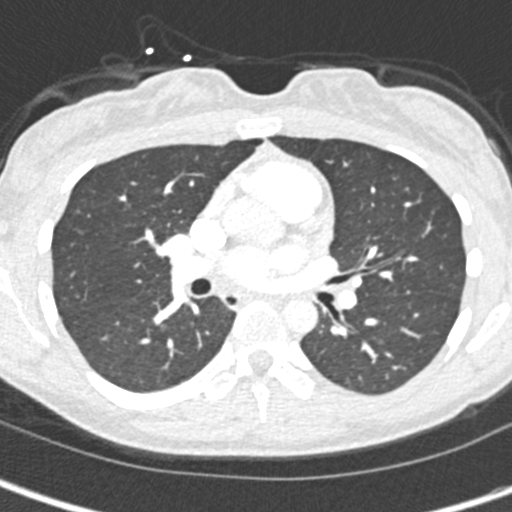
[im 167/278  soft-tissue]
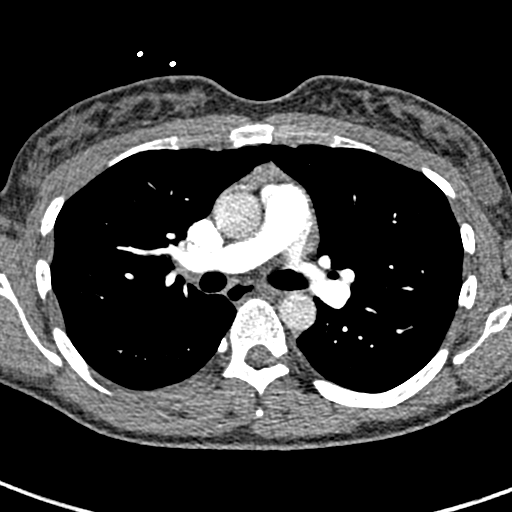
[im 181/278  lung]
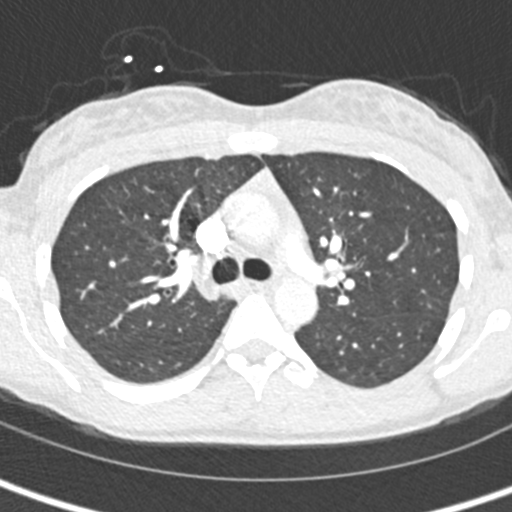
[im 194/278  soft-tissue]
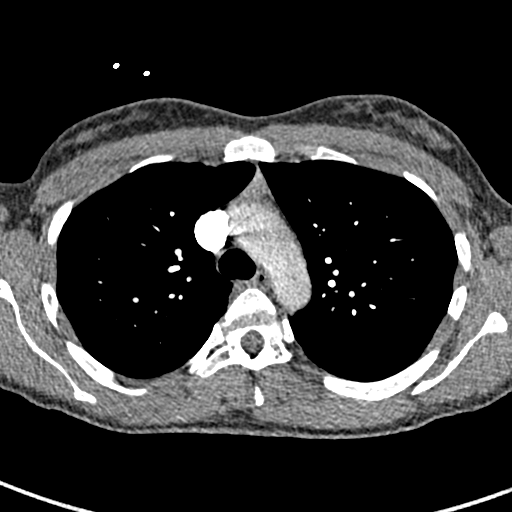
[im 208/278  lung]
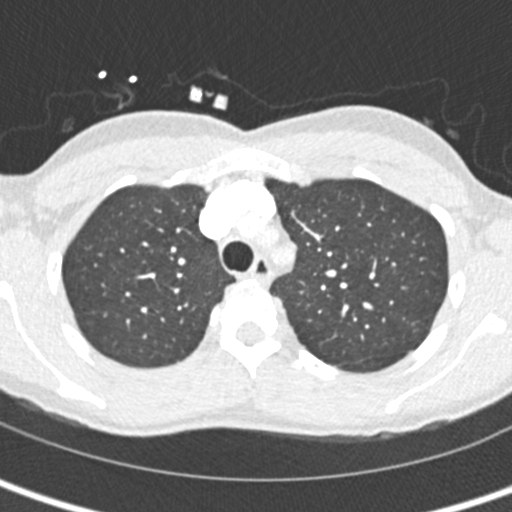
[im 236/278  soft-tissue]
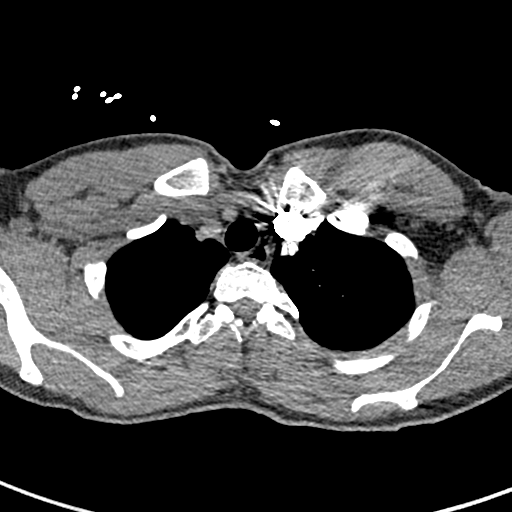
[im 250/278  lung]
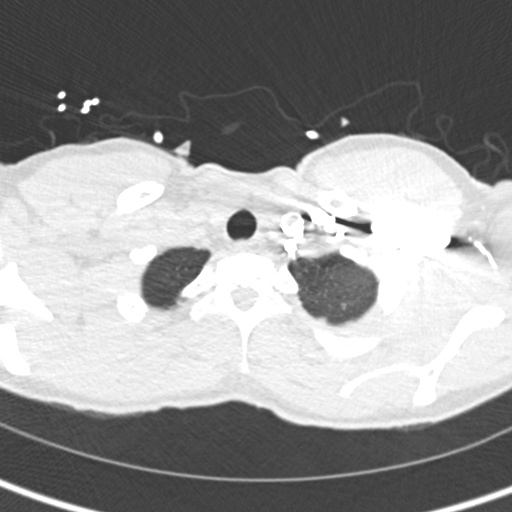
[im 264/278  soft-tissue]
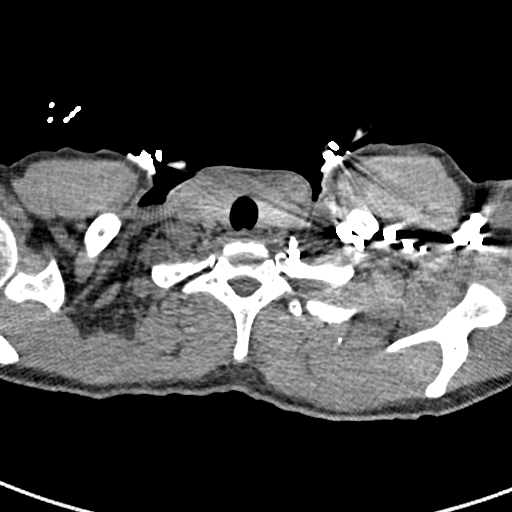

[Series 12: coronal mpr · coronal · 0.53mm/px · 3 of 151 slices shown]
[im 38/151  soft-tissue]
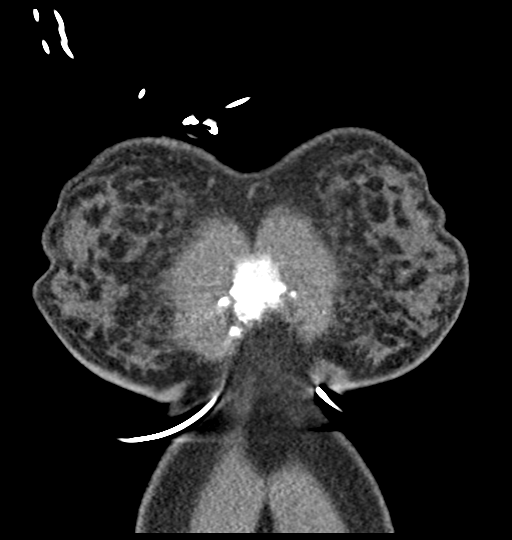
[im 76/151  soft-tissue]
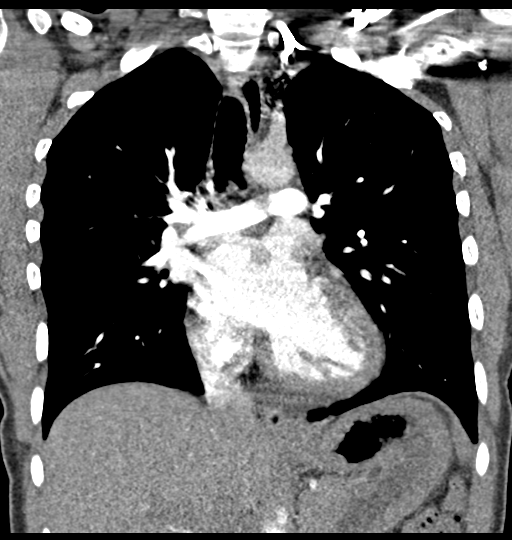
[im 113/151  soft-tissue]
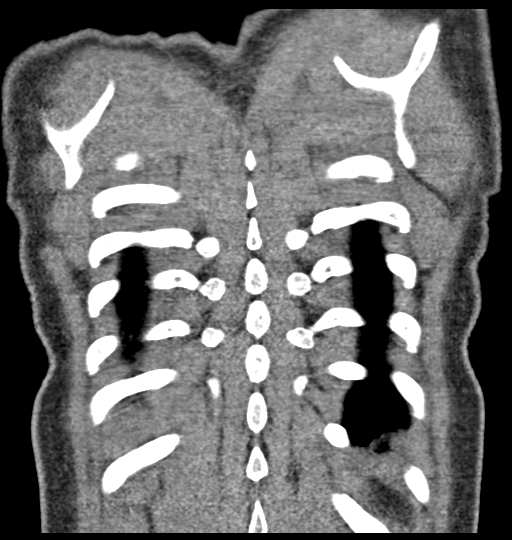

[19 of 46 positions shown; findings below may reference images not displayed]

FINDINGS: Cardiovascular: Satisfactory opacification of the pulmonary arteries
to the segmental level. No evidence of pulmonary embolism. Normal
heart size. No pericardial effusion.

Mediastinum/Nodes: No enlarged mediastinal, hilar, or axillary lymph
nodes. Thyroid gland, trachea, and esophagus demonstrate no
significant findings.

Lungs/Pleura: Lungs are clear. No pleural effusion or pneumothorax.

Upper Abdomen: No acute abnormality.

Musculoskeletal: No chest wall abnormality. No acute or significant
osseous findings.

Review of the MIP images confirms the above findings.
IMPRESSION: No evidence of significant pulmonary embolus. No evidence of active
pulmonary disease.

## 2019-05-16 IMAGING — DX DG CHEST 2V
2 series · 2 of 2 positions shown · non-contrast
Comparison: None.

CLINICAL DATA: Left-sided chest pain for 2 days.

EXAM:
CHEST  2 VIEW

[chest pa]
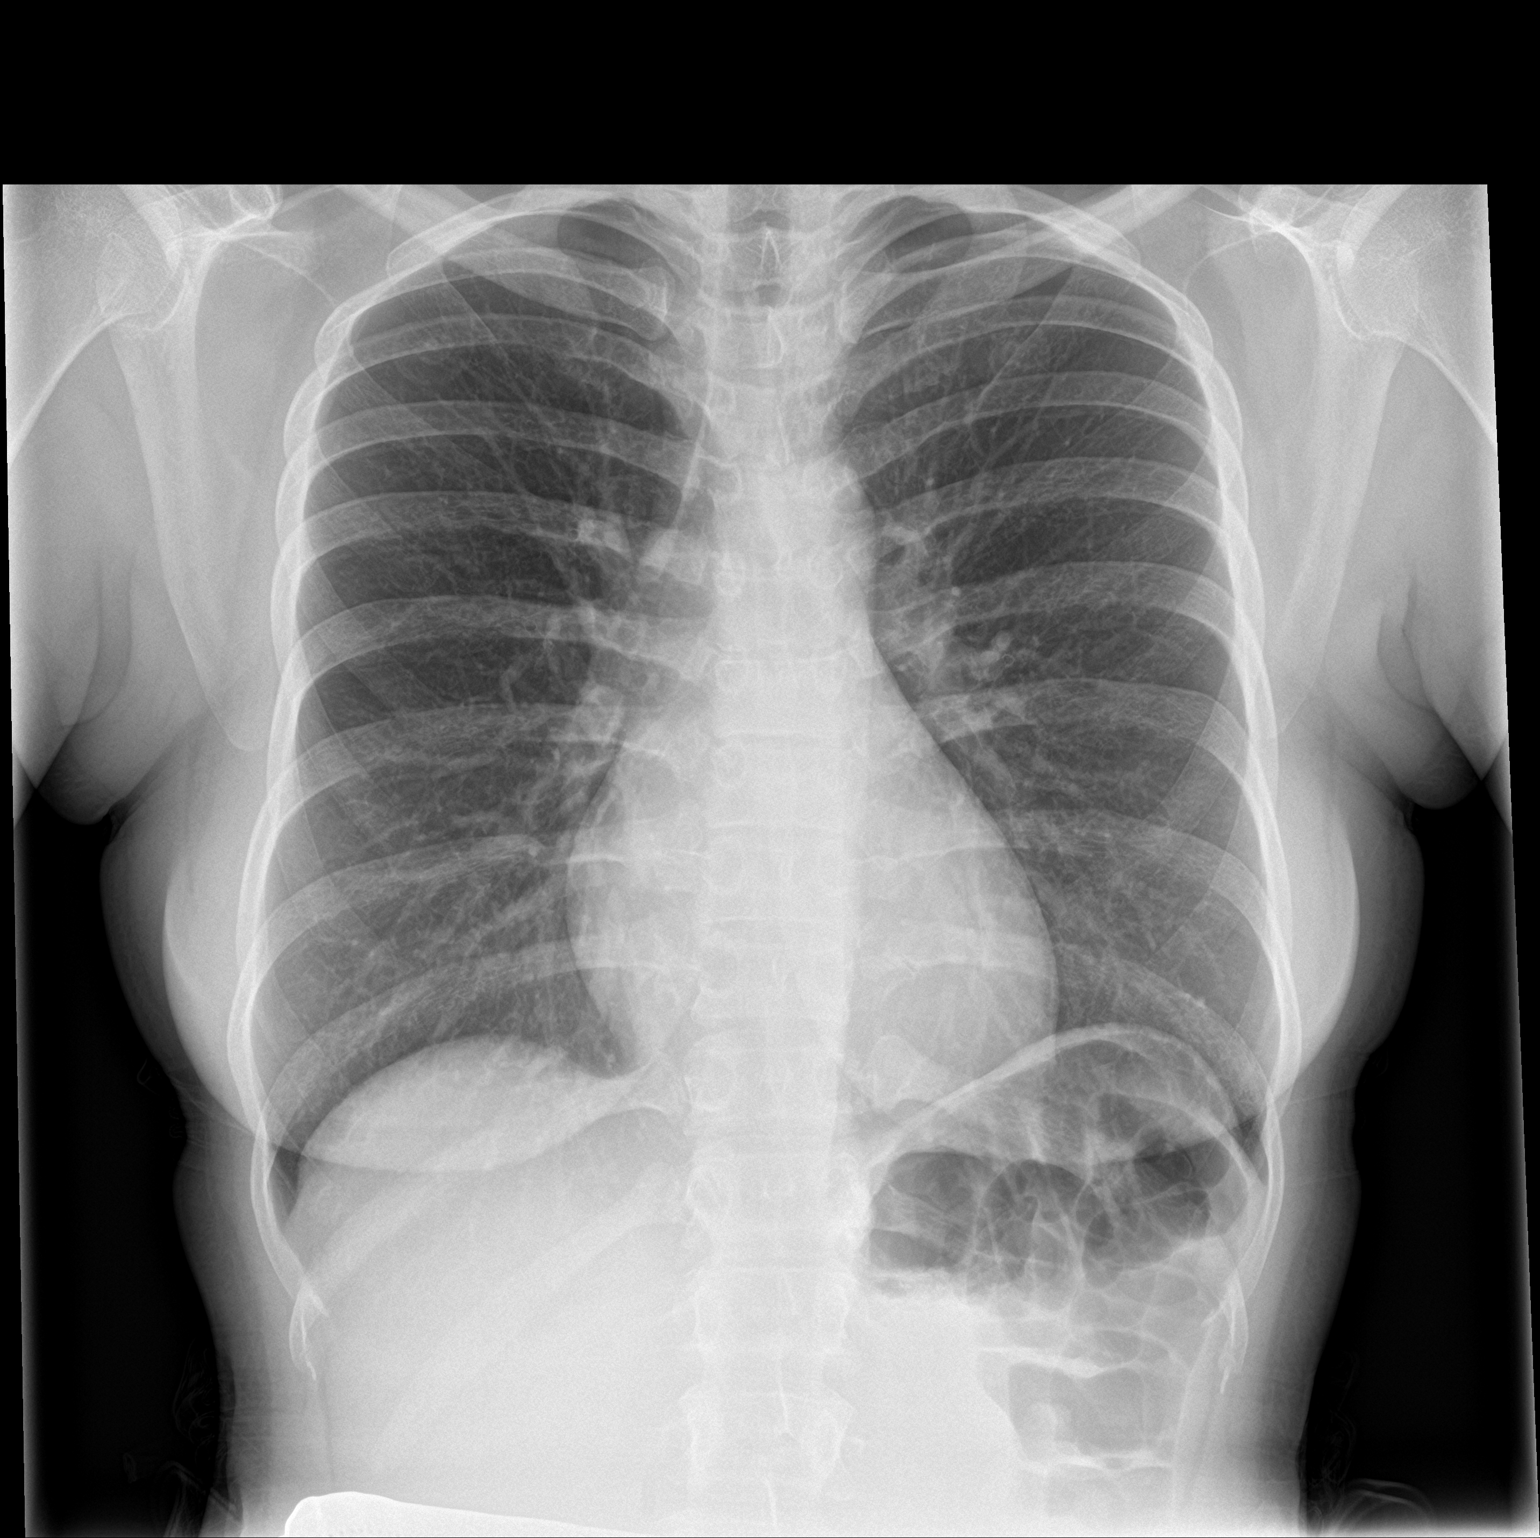

[chest lat]
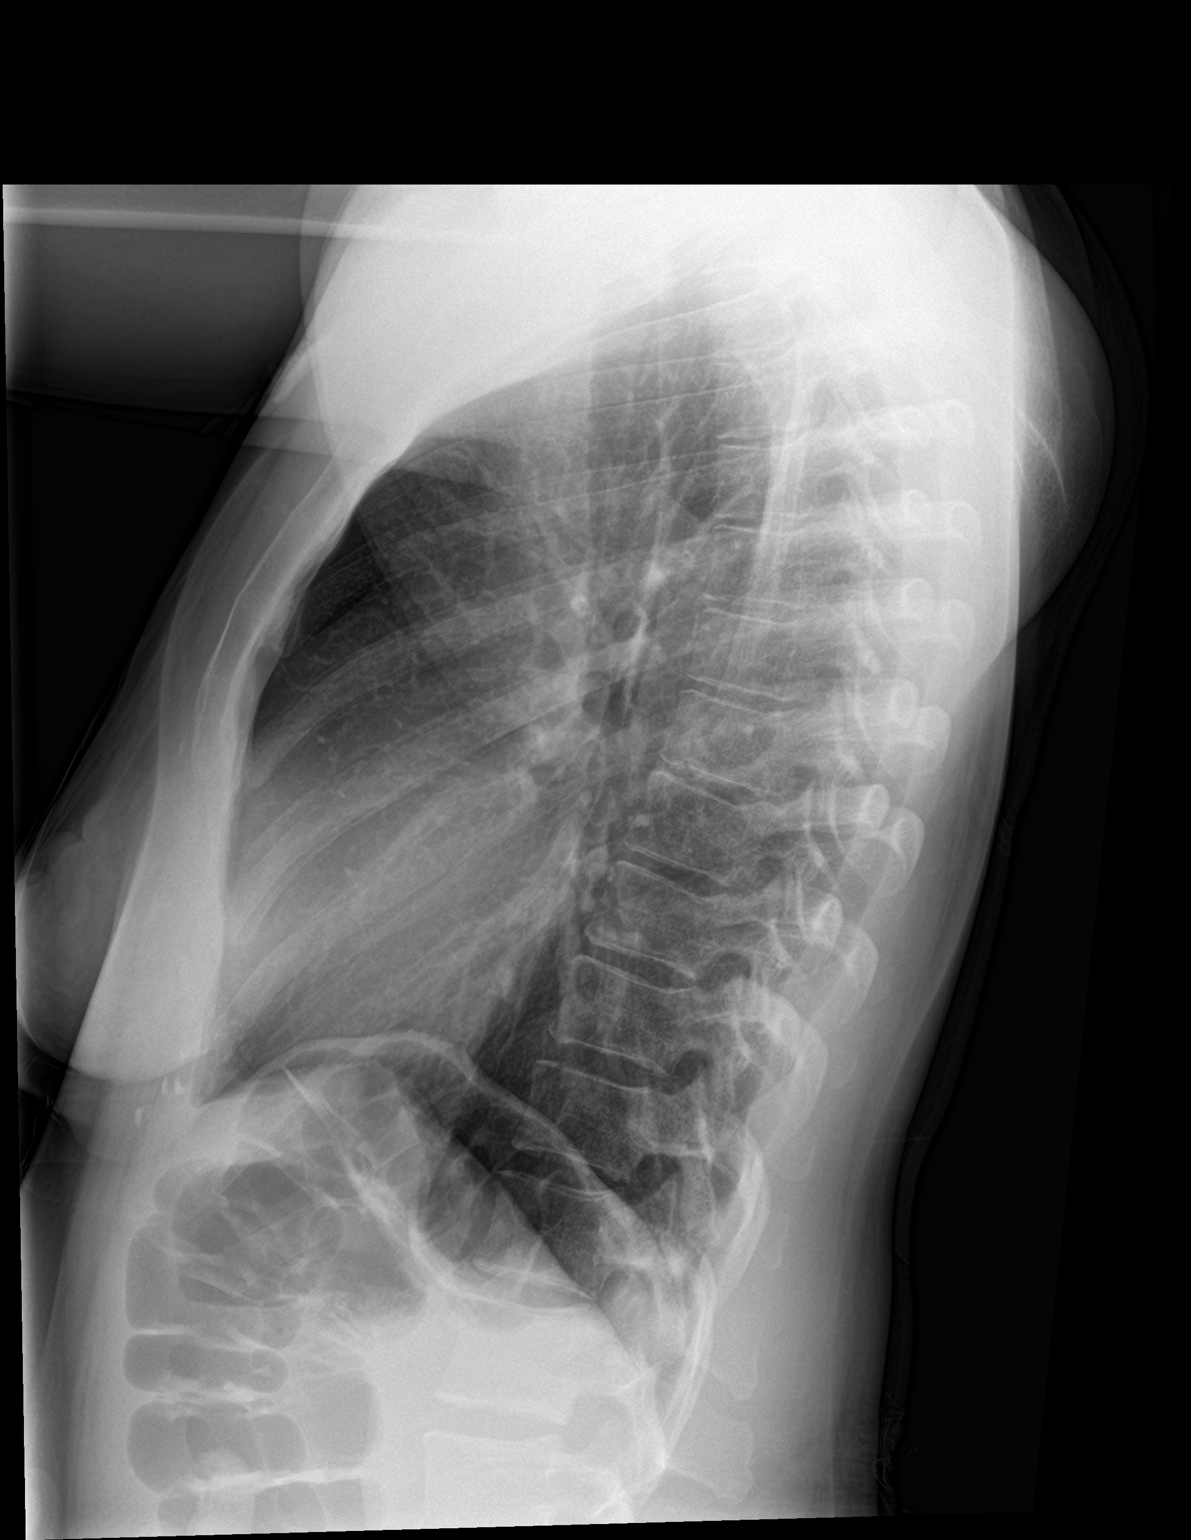

[2 of 2 positions shown; findings below may reference images not displayed]

FINDINGS: The heart size and mediastinal contours are within normal limits.
Both lungs are clear. The visualized skeletal structures are
unremarkable.
IMPRESSION: No active cardiopulmonary disease.

## 2019-09-18 ENCOUNTER — Ambulatory Visit (HOSPITAL_COMMUNITY)
Admission: EM | Admit: 2019-09-18 | Discharge: 2019-09-18 | Disposition: A | Discharge status: Home / Self Care | Payer: Self-pay | Attending: Family Medicine | Admitting: Family Medicine

## 2019-09-18 ENCOUNTER — Encounter (HOSPITAL_COMMUNITY): Payer: Self-pay | Admitting: Emergency Medicine

## 2019-09-18 ENCOUNTER — Other Ambulatory Visit: Payer: Self-pay

## 2019-09-18 DIAGNOSIS — J028 Acute pharyngitis due to other specified organisms: Secondary | ICD-10-CM

## 2019-09-18 DIAGNOSIS — B9789 Other viral agents as the cause of diseases classified elsewhere: Secondary | ICD-10-CM

## 2019-09-18 DIAGNOSIS — J029 Acute pharyngitis, unspecified: Secondary | ICD-10-CM

## 2019-09-18 LAB — POCT RAPID STREP A, ED / UC: Streptococcus, Group A Screen (Direct): NEGATIVE

## 2019-09-18 MED ORDER — IBUPROFEN 600 MG PO TABS: 600.0000 mg | ORAL_TABLET | Freq: Four times a day (QID) | ORAL | 0 refills | Status: DC | PRN

## 2019-09-18 NOTE — Discharge Instructions (Addendum)
Your strep test was negative. This is most likely something viral. You can do warm salt water gargles to the throat. Hot tea and honey may help. Ibuprofen every 8 hours as needed. Follow up as needed for continued or worsening symptoms

## 2019-09-18 NOTE — ED Triage Notes (Signed)
Sore throat for 2 days, much more severe today. Painful to swallow saliva. Son was diagnosed with URI Sunday, he has negative COVID.

## 2019-09-20 LAB — CULTURE, GROUP A STREP (THRC)

## 2019-09-20 NOTE — ED Provider Notes (Signed)
[facility]    CSN: [id number] Arrival date & time: 09/18/19  0160      History   Chief Complaint Chief Complaint  Patient presents with  . Sore Throat    HPI Jennifer Burnett is a 34 y.o. female.   Patient 35 year old female presents today with 2 days of sore throat is worsening.  Painful to swallow but no trouble swallowing.  Son was diagnosed with a respiratory infection on Sunday.  No fever, chills, body aches, cough, chest congestion.     Past Medical History:  Diagnosis Date  . PID (acute pelvic inflammatory disease)    as teenager  . Sickle cell trait ([facility])     There are no problems to display for this patient.   Past Surgical History:  Procedure Laterality Date  . CESAREAN SECTION  10/31/2011   Procedure: CESAREAN SECTION;  Surgeon: Brock Bad, MD;  Location: [facility];  Service: Obstetrics;  Laterality: N/A;  . NO PAST SURGERIES      OB History    Gravida  1   Para  1   Term  0   Preterm  1   AB  0   Living  1     SAB  0   TAB  0   Ectopic  0   Multiple  0   Live Births  1            Home Medications    Prior to Admission medications   Medication Sig Start Date End Date Taking? Authorizing Provider  cetirizine (ZYRTEC) 10 MG tablet Take 10 mg by mouth daily.   Yes [provider]  famotidine (PEPCID) 40 MG tablet Take 1 tablet (40 mg total) by mouth daily. 05/11/17   Maczis, Elmer Sow, PA-C  ibuprofen (ADVIL) 600 MG tablet Take 1 tablet (600 mg total) by mouth every 6 (six) hours as needed. 09/18/19   [name], NP  Iron-FA-B Cmp-C-Biot-Probiotic (FUSION PLUS) CAPS Take 1 capsule by mouth daily before breakfast. 09/21/16   Fayrene Helper, PA-C  lidocaine (XYLOCAINE) 2 % solution Use as directed 15 mLs in the mouth or throat as needed for mouth pain. 01/19/19   [name], [name], PA-C  methocarbamol (ROBAXIN) 500 MG tablet Take 1 tablet (500 mg total) by mouth 2 (two) times daily as needed for muscle spasms. 10/15/18    Mickie Bail, NP  ondansetron (ZOFRAN ODT) 4 MG disintegrating tablet Take 1 tablet (4 mg total) by mouth every 8 (eight) hours as needed for nausea or vomiting. 01/19/19   [name], [name], PA-C    Family History Family History  Problem Relation Age of Onset  . Anesthesia problems Neg Hx   . Hypotension Neg Hx   . Malignant hyperthermia Neg Hx   . Pseudochol deficiency Neg Hx   . Other Neg Hx     Social History Social History   Tobacco Use  . Smoking status: Never Smoker  . Smokeless tobacco: Never Used  Substance Use Topics  . Alcohol use: No  . Drug use: No     Allergies   Patient has no known allergies.   Review of Systems Review of Systems   Physical Exam Triage Vital Signs ED Triage Vitals  Enc Vitals Group     BP 09/18/19 0906 (!) 122/91     Pulse Rate 09/18/19 0903 75     Resp 09/18/19 0903 16     Temp 09/18/19 0903 97.9 F (36.6  C)     Temp Source 09/18/19 0903 Oral     SpO2 09/18/19 0903 99 %     Weight --      Height --      Head Circumference --      Peak Flow --      Pain Score 09/18/19 0902 9     Pain Loc --      Pain Edu? --      Excl. in GC? --    No data found.  Updated Vital Signs BP (!) 122/91   Pulse 75   Temp 97.9 F (36.6 C) (Oral)   Resp 16   LMP 09/15/2019   SpO2 99%   Visual Acuity Right Eye Distance:   Left Eye Distance:   Bilateral Distance:    Right Eye Near:   Left Eye Near:    Bilateral Near:     Physical Exam Vitals and nursing note reviewed.  Constitutional:      General: She is not in acute distress.    Appearance: Normal appearance. She is not ill-appearing, toxic-appearing or diaphoretic.  HENT:     Head: Normocephalic.     Nose: Nose normal.     Mouth/Throat:     Pharynx: Oropharynx is clear. Posterior oropharyngeal erythema present.  Eyes:     Conjunctiva/sclera: Conjunctivae normal.  Pulmonary:     Effort: Pulmonary effort is normal.  Musculoskeletal:        General: Normal range of motion.      Cervical back: Normal range of motion.  Skin:    General: Skin is warm and dry.     Findings: No rash.  Neurological:     Mental Status: She is alert.  Psychiatric:        Mood and Affect: Mood normal.      UC Treatments / Results  Labs (all labs ordered are listed, but only abnormal results are displayed) Labs Reviewed  CULTURE, GROUP A STREP [facility])  POCT RAPID STREP A, ED / UC    EKG   Radiology No results found.  Procedures Procedures (including critical care time)  Medications Ordered in UC Medications - No data to display  Initial Impression / Assessment and Plan / UC Course  I have reviewed the triage vital signs and the nursing notes.  Pertinent labs & imaging results that were available during my care of the patient were reviewed by me and considered in my medical decision making (see chart for details).     Sore throat Most likely viral.  Strep test negative. Warm salt water gargles to the throat, hot tea and honey.  Ibuprofen as needed. Follow up as needed for continued or worsening symptoms  Final Clinical Impressions(s) / UC Diagnoses   Final diagnoses:  Sore throat (viral)     Discharge Instructions     Your strep test was negative. This is most likely something viral. You can do warm salt water gargles to the throat. Hot tea and honey may help. Ibuprofen every 8 hours as needed. Follow up as needed for continued or worsening symptoms     ED Prescriptions    Medication Sig Dispense Auth. Provider   ibuprofen (ADVIL) 600 MG tablet Take 1 tablet (600 mg total) by mouth every 6 (six) hours as needed. 30 tablet [name], NP     PDMP not reviewed this encounter.   Janace Aris, NP 09/20/19 1214

## 2020-06-04 ENCOUNTER — Other Ambulatory Visit: Payer: Self-pay

## 2020-06-04 ENCOUNTER — Encounter (HOSPITAL_COMMUNITY): Payer: Self-pay | Admitting: *Deleted

## 2020-06-04 ENCOUNTER — Ambulatory Visit (HOSPITAL_COMMUNITY)
Admission: EM | Admit: 2020-06-04 | Discharge: 2020-06-04 | Disposition: A | Discharge status: Home / Self Care | Payer: Self-pay | Attending: Family Medicine | Admitting: Family Medicine

## 2020-06-04 DIAGNOSIS — L309 Dermatitis, unspecified: Secondary | ICD-10-CM

## 2020-06-04 MED ORDER — DEXAMETHASONE SODIUM PHOSPHATE 10 MG/ML IJ SOLN
10.0000 mg | Freq: Once | INTRAMUSCULAR | Status: AC
  Administered 2020-06-04: 10 mg via INTRAMUSCULAR

## 2020-06-04 MED ORDER — DEXAMETHASONE SODIUM PHOSPHATE 10 MG/ML IJ SOLN
INTRAMUSCULAR | Status: AC
  Filled 2020-06-04: qty 1

## 2020-06-04 MED ORDER — PREDNISONE 20 MG PO TABS: 40.0000 mg | ORAL_TABLET | Freq: Every day | ORAL | 0 refills | Status: DC

## 2020-06-04 NOTE — ED Provider Notes (Signed)
[facility]    CSN: [id number] Arrival date & time: 06/04/20  1639      History   Chief Complaint Chief Complaint  Patient presents with  . Rash    HPI Jennifer Burnett is a 35 y.o. female.   Patient presenting today with about a week of diffuse painful red rash across face and abdomen.  She states she has significant issues with eczema and gets these flares frequently, particularly when her allergy seasons are occurring.  She states her rash significantly worsens when she goes outside and interfaces with the pollen.  She denies any new hygiene products or medications, new foods, new habits and is taking Zyrtec twice daily for her allergy symptoms.  Using Shea butter on her face and patting dry which she states she can barely tolerate due to the pain.  Waiting to get established with a dermatologist, appointment is in 3 weeks.     Past Medical History:  Diagnosis Date  . PID (acute pelvic inflammatory disease)    as teenager  . Sickle cell trait (HCC)     There are no problems to display for this patient.   Past Surgical History:  Procedure Laterality Date  . CESAREAN SECTION  10/31/2011   Procedure: CESAREAN SECTION;  Surgeon: Brock Bad, MD;  Location: [facility];  Service: Obstetrics;  Laterality: N/A;  . NO PAST SURGERIES      OB History    Gravida  1   Para  1   Term  0   Preterm  1   AB  0   Living  1     SAB  0   IAB  0   Ectopic  0   Multiple  0   Live Births  1            Home Medications    Prior to Admission medications   Medication Sig Start Date End Date Taking? Authorizing Provider  predniSONE (DELTASONE) 20 MG tablet Take 2 tablets (40 mg total) by mouth daily with breakfast. 06/04/20  Yes Particia Nearing, PA-C  cetirizine (ZYRTEC) 10 MG tablet Take 10 mg by mouth daily.    [provider]  famotidine (PEPCID) 40 MG tablet Take 1 tablet (40 mg total) by mouth daily. 05/11/17   Maczis, Elmer Sow, PA-C   ibuprofen (ADVIL) 600 MG tablet Take 1 tablet (600 mg total) by mouth every 6 (six) hours as needed. 09/18/19   [name], NP  Iron-FA-B Cmp-C-Biot-Probiotic (FUSION PLUS) CAPS Take 1 capsule by mouth daily before breakfast. 09/21/16   Fayrene Helper, PA-C  lidocaine (XYLOCAINE) 2 % solution Use as directed 15 mLs in the mouth or throat as needed for mouth pain. 01/19/19   [name], [name], PA-C  methocarbamol (ROBAXIN) 500 MG tablet Take 1 tablet (500 mg total) by mouth 2 (two) times daily as needed for muscle spasms. 10/15/18   Mickie Bail, NP  ondansetron (ZOFRAN ODT) 4 MG disintegrating tablet Take 1 tablet (4 mg total) by mouth every 8 (eight) hours as needed for nausea or vomiting. 01/19/19   [name], [name], PA-C    Family History Family History  Problem Relation Age of Onset  . Anesthesia problems Neg Hx   . Hypotension Neg Hx   . Malignant hyperthermia Neg Hx   . Pseudochol deficiency Neg Hx   . Other Neg Hx     Social History Social History   Tobacco Use  . Smoking  status: Never Smoker  . Smokeless tobacco: Never Used  Substance Use Topics  . Alcohol use: No  . Drug use: No     Allergies   Patient has no known allergies.   Review of Systems Review of Systems Per HPI Physical Exam Triage Vital Signs ED Triage Vitals  Enc Vitals Group     BP 06/04/20 1703 124/81     Pulse Rate 06/04/20 1703 83     Resp 06/04/20 1703 16     Temp 06/04/20 1703 99 F (37.2 C)     Temp src --      SpO2 06/04/20 1703 100 %     Weight --      Height --      Head Circumference --      Peak Flow --      Pain Score 06/04/20 1701 10     Pain Loc --      Pain Edu? --      Excl. in GC? --    No data found.  Updated Vital Signs BP 124/81   Pulse 83   Temp 99 F (37.2 C)   Resp 16   LMP 05/30/2020   SpO2 100%   Visual Acuity Right Eye Distance:   Left Eye Distance:   Bilateral Distance:    Right Eye Near:   Left Eye Near:    Bilateral Near:     Physical Exam Vitals and  nursing note reviewed.  Constitutional:      Appearance: Normal appearance. She is not ill-appearing.  HENT:     Head: Atraumatic.     Nose: Rhinorrhea present.     Mouth/Throat:     Mouth: Mucous membranes are moist.     Pharynx: Posterior oropharyngeal erythema present. No oropharyngeal exudate.  Eyes:     Extraocular Movements: Extraocular movements intact.     Conjunctiva/sclera: Conjunctivae normal.  Cardiovascular:     Rate and Rhythm: Normal rate and regular rhythm.     Heart sounds: Normal heart sounds.  Pulmonary:     Effort: Pulmonary effort is normal.     Breath sounds: Normal breath sounds.  Musculoskeletal:        General: Normal range of motion.     Cervical back: Normal range of motion and neck supple.  Skin:    General: Skin is warm and dry.     Findings: Rash present.     Comments: Diffuse fine erythematous rash across bilateral eyelids, cheeks, perioral area.  Significantly tender to palpation  Neurological:     Mental Status: She is alert and oriented to person, place, and time.  Psychiatric:        Mood and Affect: Mood normal.        Thought Content: Thought content normal.        Judgment: Judgment normal.      UC Treatments / Results  Labs (all labs ordered are listed, but only abnormal results are displayed) Labs Reviewed - No data to display  EKG   Radiology No results found.  Procedures Procedures (including critical care time)  Medications Ordered in UC Medications  dexamethasone (DECADRON) injection 10 mg (10 mg Intramuscular Given 06/04/20 1805)    Initial Impression / Assessment and Plan / UC Course  I have reviewed the triage vital signs and the nursing notes.  Pertinent labs & imaging results that were available during my care of the patient were reviewed by me and considered in my medical decision making (see  chart for details).     Will give IM Decadron in clinic, continue unscented moisturizer twice daily to protect the  area.  We will also send prednisone in case Decadron wears off and rash returns prior to her dermatology appointment on June 16.  She has Eucrisa and hydrocortisone cream at home to spot treat with additionally.  Follow-up if significantly worsening in the meantime. Final Clinical Impressions(s) / UC Diagnoses   Final diagnoses:  Eczema, unspecified type   Discharge Instructions   None    ED Prescriptions    Medication Sig Dispense Auth. Provider   predniSONE (DELTASONE) 20 MG tablet Take 2 tablets (40 mg total) by mouth daily with breakfast. 10 tablet Particia Nearing, [place]     PDMP not reviewed this encounter.   Particia Nearing, [place] 06/04/20 1818

## 2020-06-04 NOTE — ED Triage Notes (Signed)
Pt reports an eczema flair up .

## 2023-07-31 ENCOUNTER — Ambulatory Visit
Admission: RE | Admit: 2023-07-31 | Discharge: 2023-07-31 | Disposition: A | Discharge status: Home / Self Care | Source: Ambulatory Visit | Attending: Family Medicine | Admitting: Family Medicine

## 2023-07-31 VITALS — BP 107/72 | HR 98 | Temp 98.0°F | Resp 18 | Ht 64.0 in | Wt 125.0 lb

## 2023-07-31 DIAGNOSIS — L03315 Cellulitis of perineum: Secondary | ICD-10-CM

## 2023-07-31 MED ORDER — AMOXICILLIN-POT CLAVULANATE 875-125 MG PO TABS: 1.0000 | ORAL_TABLET | Freq: Two times a day (BID) | ORAL | 0 refills | Status: AC

## 2023-07-31 MED ORDER — IBUPROFEN 600 MG PO TABS
600.0000 mg | ORAL_TABLET | Freq: Three times a day (TID) | ORAL | 0 refills | Status: AC | PRN
Start: 2023-07-31 — End: ?

## 2023-07-31 NOTE — Discharge Instructions (Signed)
 Take amoxicillin -clavulanate 875 mg--1 tab twice daily with food for 7 days  Take ibuprofen 600 mg--1 tab every 8 hours as needed for pain.  You can use the QR code/website at the back of the summary paperwork to schedule yourself a new patient appointment with primary care

## 2023-07-31 NOTE — ED Provider Notes (Signed)
 [facility]    CSN: [id number] Arrival date & time: 07/31/23  1248      History   Chief Complaint Chief Complaint  Patient presents with   Groin Pain    HPI Jennifer Burnett is a 38 y.o. female.    Groin Pain  Here for pain and swelling in her right posterior labia majora.  She first noted it yesterday and it was throbbing some.  It actually might be a little better right now.  No fever or chills  No discharge  NKDA  Last menstrual cycle began July 8.    Past Medical History:  Diagnosis Date   PID (acute pelvic inflammatory disease)    as teenager   Sickle cell trait (HCC)     There are no active problems to display for this patient.   Past Surgical History:  Procedure Laterality Date   CESAREAN SECTION  10/31/2011   Procedure: CESAREAN SECTION;  Surgeon: Carlin DELENA Centers, MD;  Location: [facility];  Service: Obstetrics;  Laterality: N/A;    OB History     Gravida  1   Para  1   Term  0   Preterm  1   AB  0   Living  1      SAB  0   IAB  0   Ectopic  0   Multiple  0   Live Births  1            Home Medications    Prior to Admission medications   Medication Sig Start Date End Date Taking? Authorizing Provider  amoxicillin -clavulanate (AUGMENTIN ) 875-125 MG tablet Take 1 tablet by mouth 2 (two) times daily for 7 days. 07/31/23 08/07/23 Yes Haston Casebolt K, MD  cetirizine (ZYRTEC) 10 MG tablet Take 10 mg by mouth daily.   Yes [provider]  ibuprofen  (ADVIL ) 600 MG tablet Take 1 tablet (600 mg total) by mouth every 8 (eight) hours as needed (pain). 07/31/23  Yes Vonna Sharlet POUR, MD  famotidine  (PEPCID ) 40 MG tablet Take 1 tablet (40 mg total) by mouth daily. 05/11/17   Maczis, Michael M, PA-C  Iron-FA-B Cmp-C-Biot-Probiotic (FUSION PLUS) CAPS Take 1 capsule by mouth daily before breakfast. 09/21/16   Nivia Colon, PA-C  lidocaine  (XYLOCAINE ) 2 % solution Use as directed 15 mLs in the mouth or throat as needed for  mouth pain. 01/19/19   [name], [name], PA-C  methocarbamol  (ROBAXIN ) 500 MG tablet Take 1 tablet (500 mg total) by mouth 2 (two) times daily as needed for muscle spasms. 10/15/18   Corlis Burnard DEL, NP    Family History Family History  Problem Relation Age of Onset   Anesthesia problems Neg Hx    Hypotension Neg Hx    Malignant hyperthermia Neg Hx    Pseudochol deficiency Neg Hx    Other Neg Hx     Social History Social History   Tobacco Use   Smoking status: Never   Smokeless tobacco: Never  Vaping Use   Vaping status: Never Used  Substance Use Topics   Alcohol use: No   Drug use: No     Allergies   Patient has no known allergies.   Review of Systems Review of Systems   Physical Exam Triage Vital Signs ED Triage Vitals  Encounter Vitals Group     BP 07/31/23 1305 107/72     Girls Systolic BP Percentile --      Girls Diastolic BP Percentile --  Boys Systolic BP Percentile --      Boys Diastolic BP Percentile --      Pulse Rate 07/31/23 1305 98     Resp 07/31/23 1305 18     Temp 07/31/23 1305 98 F (36.7 C)     Temp Source 07/31/23 1305 Oral     SpO2 07/31/23 1305 98 %     Weight 07/31/23 1303 125 lb (56.7 kg)     Height 07/31/23 1303 5' 4 (1.626 m)     Head Circumference --      Peak Flow --      Pain Score 07/31/23 1302 0     Pain Loc --      Pain Education --      Exclude from Growth Chart --    No data found.  Updated Vital Signs BP 107/72 (BP Location: Left Arm)   Pulse 98   Temp 98 F (36.7 C) (Oral)   Resp 18   Ht 5' 4 (1.626 m)   Wt 56.7 kg   LMP 07/24/2023 (Exact Date)   SpO2 98%   BMI 21.46 kg/m   Visual Acuity Right Eye Distance:   Left Eye Distance:   Bilateral Distance:    Right Eye Near:   Left Eye Near:    Bilateral Near:     Physical Exam Vitals reviewed.  Constitutional:      General: She is not in acute distress.    Appearance: She is not ill-appearing, toxic-appearing or diaphoretic.  Genitourinary:    Comments:  Chaperone is present during the time of exam.  There is very mild tenderness at the posterior end of her right labia majora and possibly some induration there.  I cannot tell if that there is any definite swelling at this time.  No fluctuance Skin:    Coloration: Skin is not pale.  Neurological:     General: No focal deficit present.     Mental Status: She is alert and oriented to person, place, and time.  Psychiatric:        Behavior: Behavior normal.      UC Treatments / Results  Labs (all labs ordered are listed, but only abnormal results are displayed) Labs Reviewed - No data to display  EKG   Radiology No results found.  Procedures Procedures (including critical care time)  Medications Ordered in UC Medications - No data to display  Initial Impression / Assessment and Plan / UC Course  I have reviewed the triage vital signs and the nursing notes.  Pertinent labs & imaging results that were available during my care of the patient were reviewed by me and considered in my medical decision making (see chart for details).     Augmentin  is sent in for possible cellulitis or folliculitis in the area.  I discussed with her that this could be a Bartholin's gland cyst or folliculitis.  She is given instructions on how toself schedule as a new patient with primary care. Final Clinical Impressions(s) / UC Diagnoses   Final diagnoses:  Cellulitis of perineum     Discharge Instructions      Take amoxicillin -clavulanate 875 mg--1 tab twice daily with food for 7 days  Take ibuprofen  600 mg--1 tab every 8 hours as needed for pain.  You can use the QR code/website at the back of the summary paperwork to schedule yourself a new patient appointment with primary care       ED Prescriptions     Medication Sig  Dispense Auth. Provider   amoxicillin -clavulanate (AUGMENTIN ) 875-125 MG tablet Take 1 tablet by mouth 2 (two) times daily for 7 days. 14 tablet Darren Caldron  K, MD   ibuprofen  (ADVIL ) 600 MG tablet Take 1 tablet (600 mg total) by mouth every 8 (eight) hours as needed (pain). 15 tablet Zykeria Laguardia K, MD      PDMP not reviewed this encounter.   Vonna Sharlet POUR, MD 07/31/23 1332

## 2023-07-31 NOTE — ED Triage Notes (Signed)
 Bump on vagina. - Entered by patient
